# Patient Record
Sex: Female | Born: 1975 | Race: White | Hispanic: No | Marital: Married | Smoking: Never smoker
Health system: Southern US, Community
[De-identification: ages and names within clinical notes are randomized; demographics above are authoritative.]

## PROBLEM LIST (undated history)

## (undated) DIAGNOSIS — B002 Herpesviral gingivostomatitis and pharyngotonsillitis: Secondary | ICD-10-CM

## (undated) DIAGNOSIS — L309 Dermatitis, unspecified: Secondary | ICD-10-CM

## (undated) DIAGNOSIS — N6009 Solitary cyst of unspecified breast: Secondary | ICD-10-CM

## (undated) DIAGNOSIS — G510 Bell's palsy: Secondary | ICD-10-CM

## (undated) DIAGNOSIS — Z9289 Personal history of other medical treatment: Secondary | ICD-10-CM

## (undated) HISTORY — DX: Dermatitis, unspecified: L30.9

## (undated) HISTORY — DX: Bell's palsy: G51.0

## (undated) HISTORY — DX: Solitary cyst of unspecified breast: N60.09

## (undated) HISTORY — DX: Personal history of other medical treatment: Z92.89

## (undated) HISTORY — DX: Herpesviral gingivostomatitis and pharyngotonsillitis: B00.2

---
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
## 2001-11-20 DIAGNOSIS — G510 Bell's palsy: Secondary | ICD-10-CM

## 2001-11-20 HISTORY — DX: Bell's palsy: G51.0

## 2008-11-20 DIAGNOSIS — N6009 Solitary cyst of unspecified breast: Secondary | ICD-10-CM

## 2008-11-20 HISTORY — DX: Solitary cyst of unspecified breast: N60.09

## 2010-11-20 HISTORY — PX: HYSTEROSCOPY WITH D & C: SHX1775

## 2010-11-20 HISTORY — PX: ENDOMETRIAL ABLATION: SHX621

## 2011-09-13 ENCOUNTER — Ambulatory Visit: Payer: Self-pay

## 2011-09-15 ENCOUNTER — Ambulatory Visit: Payer: Self-pay

## 2011-09-18 LAB — PATHOLOGY REPORT

## 2014-03-16 DIAGNOSIS — Z9289 Personal history of other medical treatment: Secondary | ICD-10-CM

## 2014-03-16 HISTORY — DX: Personal history of other medical treatment: Z92.89

## 2014-03-16 LAB — HM PAP SMEAR: HM Pap smear: NEGATIVE

## 2014-03-27 ENCOUNTER — Ambulatory Visit: Payer: Self-pay

## 2016-04-13 ENCOUNTER — Other Ambulatory Visit: Payer: Self-pay | Admitting: Obstetrics and Gynecology

## 2016-04-13 DIAGNOSIS — Z1231 Encounter for screening mammogram for malignant neoplasm of breast: Secondary | ICD-10-CM

## 2016-04-19 ENCOUNTER — Ambulatory Visit
Admission: RE | Admit: 2016-04-19 | Discharge: 2016-04-19 | Disposition: A | Discharge status: Home / Self Care | Payer: 59 | Source: Ambulatory Visit | Attending: Obstetrics and Gynecology | Admitting: Obstetrics and Gynecology

## 2016-04-19 DIAGNOSIS — Z1231 Encounter for screening mammogram for malignant neoplasm of breast: Secondary | ICD-10-CM | POA: Insufficient documentation

## 2016-04-24 ENCOUNTER — Other Ambulatory Visit: Payer: Self-pay | Admitting: Obstetrics and Gynecology

## 2016-04-24 DIAGNOSIS — R928 Other abnormal and inconclusive findings on diagnostic imaging of breast: Secondary | ICD-10-CM

## 2016-04-28 ENCOUNTER — Ambulatory Visit
Admission: RE | Admit: 2016-04-28 | Discharge: 2016-04-28 | Disposition: A | Discharge status: Home / Self Care | Payer: 59 | Source: Ambulatory Visit | Attending: Obstetrics and Gynecology | Admitting: Obstetrics and Gynecology

## 2016-04-28 DIAGNOSIS — R928 Other abnormal and inconclusive findings on diagnostic imaging of breast: Secondary | ICD-10-CM | POA: Insufficient documentation

## 2016-04-28 LAB — HM MAMMOGRAPHY

## 2017-03-29 ENCOUNTER — Other Ambulatory Visit: Payer: Self-pay | Admitting: Obstetrics and Gynecology

## 2017-03-29 DIAGNOSIS — Z1231 Encounter for screening mammogram for malignant neoplasm of breast: Secondary | ICD-10-CM

## 2017-04-24 ENCOUNTER — Ambulatory Visit
Admission: RE | Admit: 2017-04-24 | Discharge: 2017-04-24 | Disposition: A | Discharge status: Home / Self Care | Payer: BLUE CROSS/BLUE SHIELD | Source: Ambulatory Visit | Attending: Obstetrics and Gynecology | Admitting: Obstetrics and Gynecology

## 2017-04-24 DIAGNOSIS — Z1231 Encounter for screening mammogram for malignant neoplasm of breast: Secondary | ICD-10-CM | POA: Diagnosis not present

## 2017-04-25 ENCOUNTER — Encounter: Payer: Self-pay | Admitting: Obstetrics and Gynecology

## 2017-05-07 ENCOUNTER — Ambulatory Visit: Payer: Self-pay | Admitting: Obstetrics and Gynecology

## 2017-05-15 ENCOUNTER — Ambulatory Visit (INDEPENDENT_AMBULATORY_CARE_PROVIDER_SITE_OTHER): Payer: BLUE CROSS/BLUE SHIELD | Admitting: Obstetrics and Gynecology

## 2017-05-15 ENCOUNTER — Encounter: Payer: Self-pay | Admitting: Obstetrics and Gynecology

## 2017-05-15 VITALS — BP 110/62 | HR 76 | Ht 62.0 in | Wt 130.0 lb

## 2017-05-15 DIAGNOSIS — Z124 Encounter for screening for malignant neoplasm of cervix: Secondary | ICD-10-CM

## 2017-05-15 DIAGNOSIS — Z1151 Encounter for screening for human papillomavirus (HPV): Secondary | ICD-10-CM | POA: Diagnosis not present

## 2017-05-15 DIAGNOSIS — Z01419 Encounter for gynecological examination (general) (routine) without abnormal findings: Secondary | ICD-10-CM | POA: Diagnosis not present

## 2017-05-15 NOTE — Progress Notes (Signed)
Chief Complaint  Patient presents with  . Gynecologic Exam     HPI:      [name] is a 41 y.o. [phone] who LMP was No LMP recorded. Patient has had an ablation., presents today for her annual examination.  Her menses are absent due to endometrial ablation. Dysmenorrhea none. She does not have intermenstrual bleeding.  Sex activity: single partner, contraception - vasectomy.  Last Pap: March 16, 2014  Results were: no abnormalities /neg HPV DNA Hx of STDs: none  Last mammogram: April 25, 2017  Results were: normal--routine follow-up in 12 months There is a FH of breast cancer in her mat grt aunt, genetic testing not indicated. There is no FH of ovarian cancer. The patient does do self-breast exams.  Tobacco use: The patient denies current or previous tobacco use. Alcohol use: none Exercise: moderately active  She does not get adequate calcium and Vitamin D in her diet.   Past Medical History:  Diagnosis Date  . Bell's palsy 2003  . History of mammogram 03/24/2015; [id number]   birads 2; neg  . History of Papanicolaou smear of cervix 03/16/2014   -/-  . Oral herpes   . Solitary cyst of breast 2010   left - DR. BYRNETT    Past Surgical History:  Procedure Laterality Date  . [place]  . CESAREAN SECTION    . ENDOMETRIAL ABLATION  2012  . HYSTEROSCOPY W/D&C  2012  . SPLENECTOMY  1982    Family History  Problem Relation Age of Onset  . Diabetes Maternal Grandfather        TYPE II  . Cancer Other        BREAST    Social History   Social History  . Marital status: Married    Spouse name: N/A  . Number of children: 3  . Years of education: 14   Occupational History  . NURSES' [employer]   Social History Main Topics  . Smoking status: Never Smoker  . Smokeless tobacco: Never Used  . Alcohol use No  . Drug use: No  . Sexual activity: Yes    Birth control/ protection: Surgical     Comment: VASECTOMY   Other Topics Concern    . Not on file   Social History Narrative  . No narrative on file     Current Outpatient Prescriptions:  .  valACYclovir (VALTREX) 1000 MG tablet, Take by mouth., Disp: , Rfl:   ROS:  Review of Systems  Constitutional: Negative for fatigue, fever and unexpected weight change.  Respiratory: Negative for cough, shortness of breath and wheezing.   Cardiovascular: Negative for chest pain, palpitations and leg swelling.  Gastrointestinal: Negative for blood in stool, constipation, diarrhea, nausea and vomiting.  Endocrine: Negative for cold intolerance, heat intolerance and polyuria.  Genitourinary: Negative for dyspareunia, dysuria, flank pain, frequency, genital sores, hematuria, menstrual problem, pelvic pain, urgency, vaginal bleeding, vaginal discharge and vaginal pain.  Musculoskeletal: Negative for back pain, joint swelling and myalgias.  Skin: Negative for rash.  Neurological: Negative for dizziness, syncope, light-headedness, numbness and headaches.  Hematological: Negative for adenopathy.  Psychiatric/Behavioral: Negative for agitation, confusion, sleep disturbance and suicidal ideas. The patient is not nervous/anxious.      Objective: BP 110/62 (BP Location: Left Arm, Patient Position: Sitting, Cuff Size: Normal)   Pulse 76   Ht 5' 2"  (1.575 m)   Wt 130 lb (59 kg)   BMI  23.78 kg/m    Physical Exam  Constitutional: She is oriented to person, place, and time. She appears well-developed and well-nourished.  Genitourinary: Vagina normal and uterus normal. There is no rash or tenderness on the right labia. There is no rash or tenderness on the left labia. No erythema or tenderness in the vagina. No vaginal discharge found. Right adnexum does not display mass and does not display tenderness. Left adnexum does not display mass and does not display tenderness. Cervix does not exhibit motion tenderness or polyp. Uterus is not enlarged or tender.  Neck: Normal range of motion. No  thyromegaly present.  Cardiovascular: Normal rate, regular rhythm and normal heart sounds.   No murmur heard. Pulmonary/Chest: Effort normal and breath sounds normal. Right breast exhibits no mass, no nipple discharge, no skin change and no tenderness. Left breast exhibits no mass, no nipple discharge, no skin change and no tenderness.  Abdominal: Soft. There is no tenderness. There is no guarding.  Musculoskeletal: Normal range of motion.  Neurological: She is alert and oriented to person, place, and time. No cranial nerve deficit.  Psychiatric: She has a normal mood and affect. Her behavior is normal.  Vitals reviewed.   Assessment/Plan:  Encounter for annual routine gynecological examination  Cervical cancer screening - Plan: IGP, Aptima HPV  Screening for HPV (human papillomavirus) - Plan: IGP, Aptima HPV           GYN counsel adequate intake of calcium and vitamin D, diet and exercise     F/U  Return in about 1 year (around 05/15/2018).  Alicia B. Copland, PA-C 05/15/2017 2:35 PM

## 2017-05-18 LAB — IGP, APTIMA HPV
HPV Aptima: NEGATIVE
PAP Smear Comment: 0

## 2017-05-21 ENCOUNTER — Encounter (HOSPITAL_COMMUNITY): Payer: Self-pay

## 2017-05-21 ENCOUNTER — Emergency Department (HOSPITAL_COMMUNITY)
Admission: EM | Admit: 2017-05-21 | Discharge: 2017-05-21 | Disposition: A | Discharge status: Home / Self Care | Payer: BLUE CROSS/BLUE SHIELD | Attending: Emergency Medicine | Admitting: Emergency Medicine

## 2017-05-21 ENCOUNTER — Emergency Department (HOSPITAL_COMMUNITY): Payer: BLUE CROSS/BLUE SHIELD

## 2017-05-21 DIAGNOSIS — Y999 Unspecified external cause status: Secondary | ICD-10-CM | POA: Insufficient documentation

## 2017-05-21 DIAGNOSIS — X58XXXA Exposure to other specified factors, initial encounter: Secondary | ICD-10-CM | POA: Diagnosis not present

## 2017-05-21 DIAGNOSIS — Y9319 Activity, other involving water and watercraft: Secondary | ICD-10-CM | POA: Diagnosis not present

## 2017-05-21 DIAGNOSIS — S53402A Unspecified sprain of left elbow, initial encounter: Secondary | ICD-10-CM

## 2017-05-21 DIAGNOSIS — Y9262 Dock or shipyard as the place of occurrence of the external cause: Secondary | ICD-10-CM | POA: Diagnosis not present

## 2017-05-21 DIAGNOSIS — S59901A Unspecified injury of right elbow, initial encounter: Secondary | ICD-10-CM | POA: Diagnosis present

## 2017-05-21 DIAGNOSIS — Z9889 Other specified postprocedural states: Secondary | ICD-10-CM | POA: Insufficient documentation

## 2017-05-21 NOTE — Discharge Instructions (Signed)
Apply an elbow sleeve or ACE wrap as needed for support, do not wear it continuously.  Ibuprofen or aleve if needed for pain.  Apply ice packs on/off.  Call your orthopedic provider or the one listed to arrange a follow-up appt in one week if not improving

## 2017-05-21 NOTE — ED Provider Notes (Signed)
[facility] DEPT Provider Note   CSN: [id number] Arrival date & time: 05/21/17  1922     History   Chief Complaint Chief Complaint  Patient presents with  . Arm Injury    HPI Joan Phillips is a 41 y.o. female.  HPI   Joan Phillips is a 41 y.o. female who presents to the Emergency Department complaining of left elbow pain for two days.  Complains of pain with movement and extension of the elbow.  She states that she "pushed off" a dock using her left arm.  Initially, she had pain to the left wrist that resolved, but the following day she noticed pain with certain movements at the elbow.  She denies weakness or numbness of the extremity, swelling, discoloration, shoulder or neck pain.     Past Medical History:  Diagnosis Date  . Bell's palsy 2003  . History of mammogram 03/24/2015; [id number]   birads 2; neg  . History of Papanicolaou smear of cervix 03/16/2014   -/-  . Oral herpes   . Solitary cyst of breast 2010   left - DR. BYRNETT    There are no active problems to display for this patient.   Past Surgical History:  Procedure Laterality Date  . [place]  . CESAREAN SECTION    . ENDOMETRIAL ABLATION  2012  . HYSTEROSCOPY W/D&C  2012  . SPLENECTOMY  1982    OB History    Gravida Para Term Preterm AB Living   3 3 3     3    SAB TAB Ectopic Multiple Live Births           3       Home Medications    Prior to Admission medications   Medication Sig Start Date End Date Taking? Authorizing Provider  valACYclovir (VALTREX) 1000 MG tablet Take by mouth.    [provider]    Family History Family History  Problem Relation Age of Onset  . Diabetes Maternal Grandfather        TYPE II  . Cancer Other        BREAST    Social History Social History  Substance Use Topics  . Smoking status: Never Smoker  . Smokeless tobacco: Never Used  . Alcohol use No     Allergies   Patient has no known allergies.   Review of Systems Review of  Systems  Constitutional: Negative for chills and fever.  Cardiovascular: Negative for chest pain.  Genitourinary: Negative for difficulty urinating and dysuria.  Musculoskeletal: Positive for arthralgias (left elbow pain). Negative for joint swelling, neck pain and neck stiffness.  Skin: Negative for color change, rash and wound.  Neurological: Negative for dizziness, weakness, numbness and headaches.  All other systems reviewed and are negative.    Physical Exam Updated Vital Signs BP 121/65 (BP Location: Right Arm)   Pulse 75   Temp 98.7 F (37.1 C) (Oral)   Resp 15   Ht 5' 2"  (1.575 m)   Wt 59 kg (130 lb)   SpO2 99%   BMI 23.78 kg/m   Physical Exam  Constitutional: She is oriented to person, place, and time. She appears well-developed and well-nourished. No distress.  HENT:  Head: Atraumatic.  Mouth/Throat: Oropharynx is clear and moist.  Neck: Normal range of motion. Neck supple.  Cardiovascular: Normal rate, regular rhythm and intact distal pulses.   Pulmonary/Chest: Effort normal. No respiratory distress.  Musculoskeletal: She exhibits tenderness. She exhibits no edema  or deformity.       Left elbow: She exhibits no swelling, no effusion and no deformity.  Focal ttp just superior to the left elbow joint.  Pain with full extension and pronation.  No effusion, erythema or edema of the joint. Grip strength is strong and symmetrical.   Neurological: She is alert and oriented to person, place, and time. No sensory deficit.  Skin: Skin is warm. Capillary refill takes less than 2 seconds. No rash noted. No erythema.  Nursing note and vitals reviewed.    ED Treatments / Results  Labs (all labs ordered are listed, but only abnormal results are displayed) Labs Reviewed - No data to display  EKG  EKG Interpretation None       Radiology Dg Elbow Complete Left  Result Date: 05/21/2017 CLINICAL DATA:  Elbow injury with pain. EXAM: LEFT ELBOW - COMPLETE 3+ VIEW  COMPARISON:  None. FINDINGS: No acute fracture is identified. No subluxation or dislocation. Degenerative changes are seen at the coronoid process of the ulna. Anterior fat pad is elevated and posterior fat pad is visible, findings consistent with the presence of a joint effusion. IMPRESSION: Positive for joint effusion without acute bony abnormality evident. Follow-up MRI may prove helpful to further evaluate. 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 Electronically Signed   By: Misty Stanley M.D.   On: 05/21/2017 19:55    Procedures Procedures (including critical care time)  Medications Ordered in ED Medications - No data to display   Initial Impression / Assessment and Plan / ED Course  I have reviewed the triage vital signs and the nursing notes.  Pertinent labs & imaging results that were available during my care of the patient were reviewed by me and considered in my medical decision making (see chart for details).     Patient is well-appearing. Vital signs are stable. Neurovascularly intact. X-ray reassuring. Symptoms are likely related to ligamentous injury.  Pt agrees to RICE therapy, anti-inflammatory and orthopedic f/u in one week if needed.   Final Clinical Impressions(s) / ED Diagnoses   Final diagnoses:  Sprain of left elbow, initial encounter    New Prescriptions Discharge Medication List as of 05/21/2017  8:19 PM       Kem Parkinson, PA-C 05/21/17 2051    Mesner, Corene Cornea, MD 05/24/17 [phone]

## 2017-05-21 NOTE — ED Triage Notes (Signed)
Reports of left elbow pain since Saturday after pushing self away from dock in water.

## 2017-05-21 NOTE — ED Notes (Signed)
Pt states understanding of care given and follow up instructions.  Pt a/o ambulated from ED with steady gait

## 2018-02-05 ENCOUNTER — Other Ambulatory Visit: Payer: Self-pay | Admitting: Obstetrics and Gynecology

## 2018-05-16 ENCOUNTER — Encounter: Payer: Self-pay | Admitting: Obstetrics and Gynecology

## 2018-05-16 ENCOUNTER — Ambulatory Visit (INDEPENDENT_AMBULATORY_CARE_PROVIDER_SITE_OTHER): Payer: BC Managed Care – PPO | Admitting: Obstetrics and Gynecology

## 2018-05-16 VITALS — BP 100/60 | Ht 61.0 in | Wt 129.0 lb

## 2018-05-16 DIAGNOSIS — Z13220 Encounter for screening for lipoid disorders: Secondary | ICD-10-CM | POA: Diagnosis not present

## 2018-05-16 DIAGNOSIS — Z1231 Encounter for screening mammogram for malignant neoplasm of breast: Secondary | ICD-10-CM | POA: Diagnosis not present

## 2018-05-16 DIAGNOSIS — Z0000 Encounter for general adult medical examination without abnormal findings: Secondary | ICD-10-CM

## 2018-05-16 DIAGNOSIS — Z1239 Encounter for other screening for malignant neoplasm of breast: Secondary | ICD-10-CM

## 2018-05-16 DIAGNOSIS — B002 Herpesviral gingivostomatitis and pharyngotonsillitis: Secondary | ICD-10-CM | POA: Diagnosis not present

## 2018-05-16 DIAGNOSIS — Z01411 Encounter for gynecological examination (general) (routine) with abnormal findings: Secondary | ICD-10-CM

## 2018-05-16 DIAGNOSIS — Z01419 Encounter for gynecological examination (general) (routine) without abnormal findings: Secondary | ICD-10-CM

## 2018-05-16 NOTE — Progress Notes (Signed)
Chief Complaint  Patient presents with  . Gynecologic Exam     HPI:      [name] is a 42 y.o. [phone] who LMP was No LMP recorded. Patient has had an ablation., presents today for her annual examination. Her menses are absent due to endometrial ablation. Dysmenorrhea none. She does not have intermenstrual bleeding.  Sex activity: single partner, contraception - vasectomy.  Last Pap: 05/15/17  Results were: no abnormalities /neg HPV DNA Hx of STDs: none  Last mammogram: April 24, 2017  Results were: normal--routine follow-up in 12 months There is a FH of breast cancer in her mat grt aunt, genetic testing not indicated. There is no FH of ovarian cancer. The patient does do self-breast exams.  Tobacco use: The patient denies current or previous tobacco use. Alcohol use: none Exercise: moderately active  She does get adequate calcium and Vitamin D in her diet.  No recent labs.   Past Medical History:  Diagnosis Date  . Bell's palsy 2003  . History of mammogram 03/24/2015; [id number]   birads 2; neg  . History of Papanicolaou smear of cervix 03/16/2014   -/-  . Oral herpes   . Solitary cyst of breast 2010   left - DR. BYRNETT    Past Surgical History:  Procedure Laterality Date  . [place]  . CESAREAN SECTION    . ENDOMETRIAL ABLATION  2012  . HYSTEROSCOPY W/D&C  2012  . SPLENECTOMY  1982    Family History  Problem Relation Age of Onset  . Diabetes Maternal Grandfather        TYPE II  . Cancer Other        BREAST    Social History   Socioeconomic History  . Marital status: Married    Spouse name: Not on file  . Number of children: 3  . Years of education: 57  . Highest education level: Not on file  Occupational History  . 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 Financial resource strain: Not on file  . Food insecurity:    Worry: Not on file    Inability: Not on file  .  Transportation needs:    Medical: Not on file    Non-medical: Not on file  Tobacco Use  . Smoking status: Never Smoker  . Smokeless tobacco: Never Used  Substance and Sexual Activity  . Alcohol use: No  . Drug use: No  . Sexual activity: Yes    Birth control/protection: Surgical    Comment: VASECTOMY  Lifestyle  . Physical activity:    Days per week: Not on file    Minutes per session: Not on file  . Stress: Not on file  Relationships  . Social connections:    Talks on phone: Not on file    Gets together: Not on file    Attends religious service: Not on file    Active member of club or organization: Not on file    Attends meetings of clubs or organizations: Not on file    Relationship status: Not on file  . Intimate partner violence:    Fear of current or ex partner: Not on file    Emotionally abused: Not on file    Physically abused: Not on file    Forced sexual activity: Not on file  Other Topics Concern  . Not on file  Social History Narrative  .  Not on file    Current Outpatient Medications on File Prior to Visit  Medication Sig Dispense Refill  . valACYclovir (VALTREX) 1000 MG tablet TAKE TWO TABLETS BY MOUTH TWICE DAILY FOR ONE DAY AS NEEDED FOR SYMPTOMS 30 tablet 0   No current facility-administered medications on file prior to visit.       ROS:  Review of Systems  Constitutional: Negative for fatigue, fever and unexpected weight change.  Respiratory: Negative for cough, shortness of breath and wheezing.   Cardiovascular: Negative for chest pain, palpitations and leg swelling.  Gastrointestinal: Negative for blood in stool, constipation, diarrhea, nausea and vomiting.  Endocrine: Negative for cold intolerance, heat intolerance and polyuria.  Genitourinary: Negative for dyspareunia, dysuria, flank pain, frequency, genital sores, hematuria, menstrual problem, pelvic pain, urgency, vaginal bleeding, vaginal discharge and vaginal pain.  Musculoskeletal:  Negative for back pain, joint swelling and myalgias.  Skin: Negative for rash.  Neurological: Negative for dizziness, syncope, light-headedness, numbness and headaches.  Hematological: Negative for adenopathy.  Psychiatric/Behavioral: Negative for agitation, confusion, sleep disturbance and suicidal ideas. The patient is not nervous/anxious.      Objective: BP 100/60   Ht 5' 1"  (1.549 m)   Wt 129 lb (58.5 kg)   BMI 24.37 kg/m    Physical Exam  Constitutional: She is oriented to person, place, and time. She appears well-developed and well-nourished.  Genitourinary: Vagina normal and uterus normal. There is no rash or tenderness on the right labia. There is no rash or tenderness on the left labia. No erythema or tenderness in the vagina. No vaginal discharge found. Right adnexum does not display mass and does not display tenderness. Left adnexum does not display mass and does not display tenderness. Cervix does not exhibit motion tenderness or polyp. Uterus is not enlarged or tender.  Neck: Normal range of motion. No thyromegaly present.  Cardiovascular: Normal rate, regular rhythm and normal heart sounds.  No murmur heard. Pulmonary/Chest: Effort normal and breath sounds normal. Right breast exhibits no mass, no nipple discharge, no skin change and no tenderness. Left breast exhibits no mass, no nipple discharge, no skin change and no tenderness.  Abdominal: Soft. There is no tenderness. There is no guarding.  Musculoskeletal: Normal range of motion.  Neurological: She is alert and oriented to person, place, and time. No cranial nerve deficit.  Psychiatric: She has a normal mood and affect. Her behavior is normal.  Vitals reviewed.   Assessment/Plan: Encounter for annual routine gynecological examination  Screening for breast cancer - Pt to sched mammo - Plan: MM DIGITAL SCREENING BILATERAL  Blood tests for routine general physical examination - Plan: Comprehensive metabolic panel,  Lipid panel  Screening cholesterol level - Plan: Lipid panel  Oral herpes - Will call for valtrex RF prn.   GYN counsel breast self exam, mammography screening, adequate intake of calcium and vitamin D, diet and exercise     F/U  Return in about 1 year (around 05/17/2019).  Alicia B. Copland, PA-C 05/16/2018 9:01 AM

## 2018-05-16 NOTE — Patient Instructions (Addendum)
I value your feedback and entrusting [place] with your care. If you get a [facility] patient survey, I would appreciate you taking the time to let us know about your experience today. Thank you!  [place] at [facility]: [phone]

## 2018-05-17 LAB — COMPREHENSIVE METABOLIC PANEL
A/G RATIO: 1.9 (ref 1.2–2.2)
ALBUMIN: 4.6 g/dL (ref 3.5–5.5)
ALT: 14 IU/L (ref 0–32)
AST: 13 IU/L (ref 0–40)
Alkaline Phosphatase: 46 IU/L (ref 39–117)
BUN/Creatinine Ratio: 16 (ref 9–23)
BUN: 13 mg/dL (ref 6–24)
Bilirubin Total: 0.7 mg/dL (ref 0.0–1.2)
CALCIUM: 9.7 mg/dL (ref 8.7–10.2)
CO2: 23 mmol/L (ref 20–29)
Chloride: 103 mmol/L (ref 96–106)
Creatinine, Ser: 0.82 mg/dL (ref 0.57–1.00)
GFR calc Af Amer: 101 mL/min/{1.73_m2} (ref >59)
GFR, EST NON AFRICAN AMERICAN: 87 mL/min/{1.73_m2} (ref >59)
Globulin, Total: 2.4 g/dL (ref 1.5–4.5)
Glucose: 89 mg/dL (ref 65–99)
POTASSIUM: 4.3 mmol/L (ref 3.5–5.2)
Sodium: 139 mmol/L (ref 134–144)
TOTAL PROTEIN: 7 g/dL (ref 6.0–8.5)

## 2018-05-17 LAB — LIPID PANEL
CHOL/HDL RATIO: 2.8 ratio (ref 0.0–4.4)
Cholesterol, Total: 156 mg/dL (ref 100–199)
HDL: 56 mg/dL (ref >39)
LDL Calculated: 90 mg/dL (ref 0–99)
Triglycerides: 50 mg/dL (ref 0–149)
VLDL Cholesterol Cal: 10 mg/dL (ref 5–40)

## 2018-05-31 ENCOUNTER — Ambulatory Visit
Admission: RE | Admit: 2018-05-31 | Discharge: 2018-05-31 | Disposition: A | Discharge status: Home / Self Care | Payer: BC Managed Care – PPO | Source: Ambulatory Visit | Attending: Obstetrics and Gynecology | Admitting: Obstetrics and Gynecology

## 2018-05-31 DIAGNOSIS — Z1239 Encounter for other screening for malignant neoplasm of breast: Secondary | ICD-10-CM

## 2018-05-31 DIAGNOSIS — Z1231 Encounter for screening mammogram for malignant neoplasm of breast: Secondary | ICD-10-CM | POA: Diagnosis not present

## 2018-06-03 ENCOUNTER — Other Ambulatory Visit: Payer: Self-pay | Admitting: Obstetrics and Gynecology

## 2018-06-03 DIAGNOSIS — N6320 Unspecified lump in the left breast, unspecified quadrant: Secondary | ICD-10-CM

## 2018-06-03 DIAGNOSIS — R928 Other abnormal and inconclusive findings on diagnostic imaging of breast: Secondary | ICD-10-CM

## 2018-06-11 ENCOUNTER — Encounter: Payer: Self-pay | Admitting: Obstetrics and Gynecology

## 2018-06-11 ENCOUNTER — Ambulatory Visit
Admission: RE | Admit: 2018-06-11 | Discharge: 2018-06-11 | Disposition: A | Discharge status: Home / Self Care | Payer: BC Managed Care – PPO | Source: Ambulatory Visit | Attending: Obstetrics and Gynecology | Admitting: Obstetrics and Gynecology

## 2018-06-11 DIAGNOSIS — R928 Other abnormal and inconclusive findings on diagnostic imaging of breast: Secondary | ICD-10-CM

## 2018-06-11 DIAGNOSIS — N6320 Unspecified lump in the left breast, unspecified quadrant: Secondary | ICD-10-CM

## 2019-03-07 IMAGING — MG MM DIGITAL SCREENING BILAT W/ TOMO W/ CAD
6 of 10 series · 6 of 26 positions shown · non-contrast
Comparison: Previous exam(s).

CLINICAL DATA: Screening.

EXAM:
DIGITAL SCREENING BILATERAL MAMMOGRAM WITH TOMO AND CAD

[R MLO synth-2D]
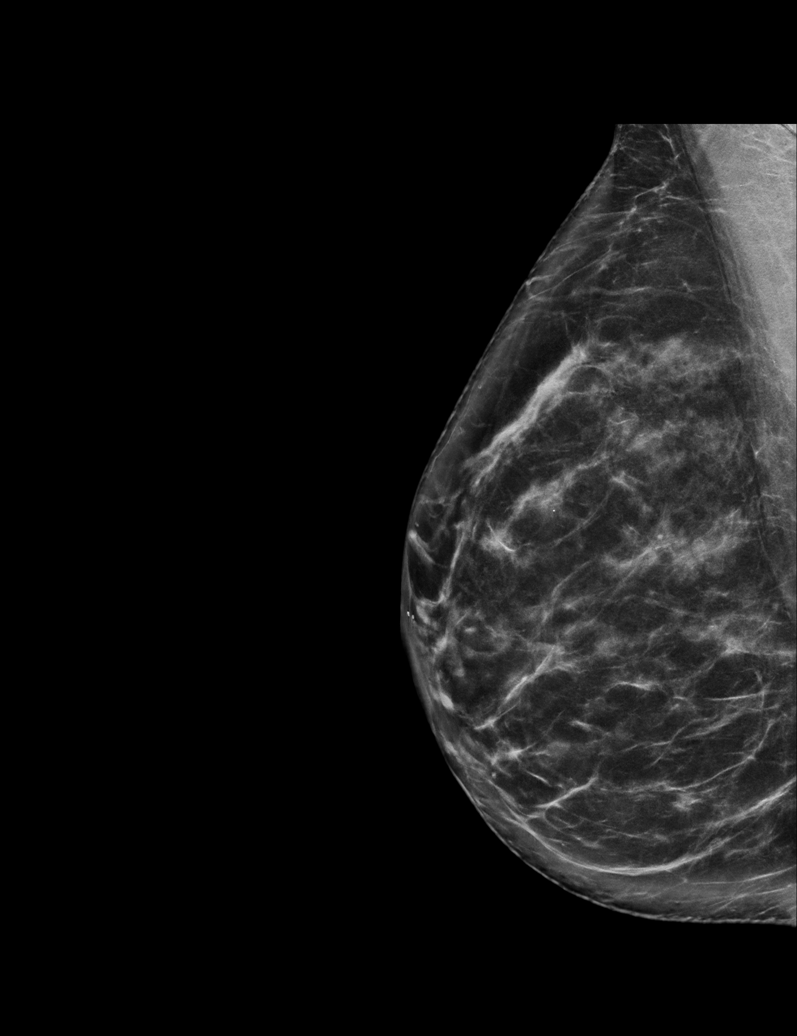

[L CC synth-2D]
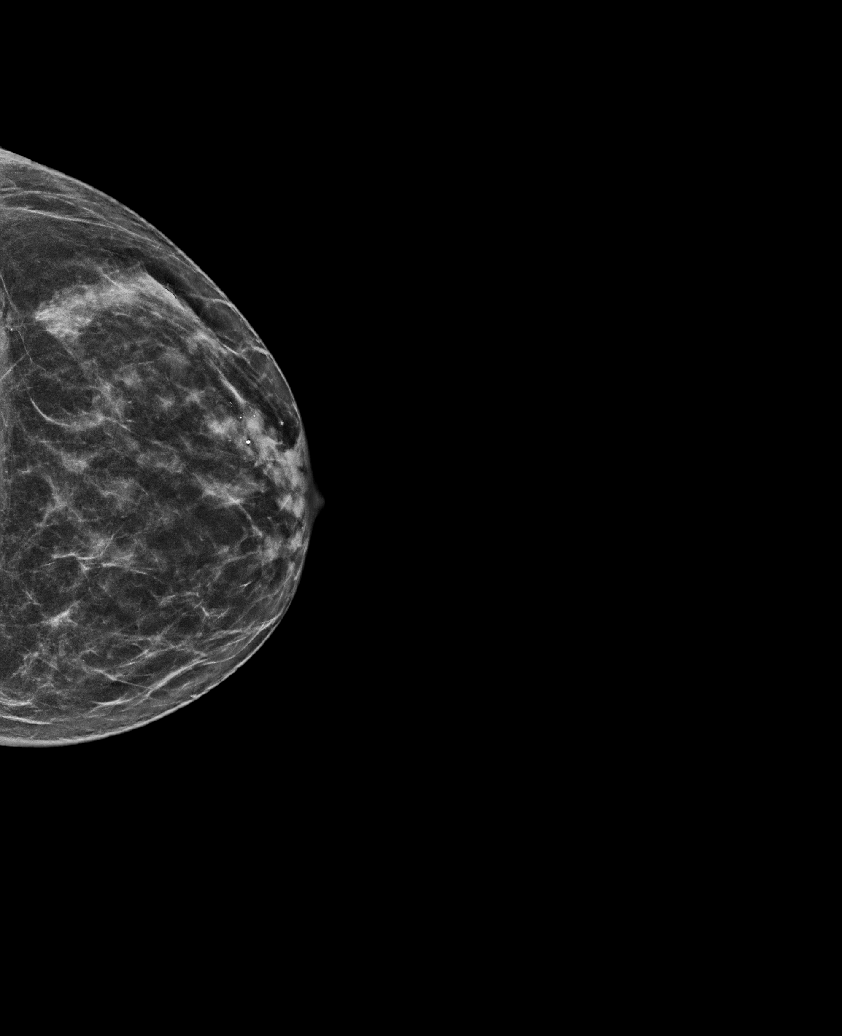

[R CC synth-2D]
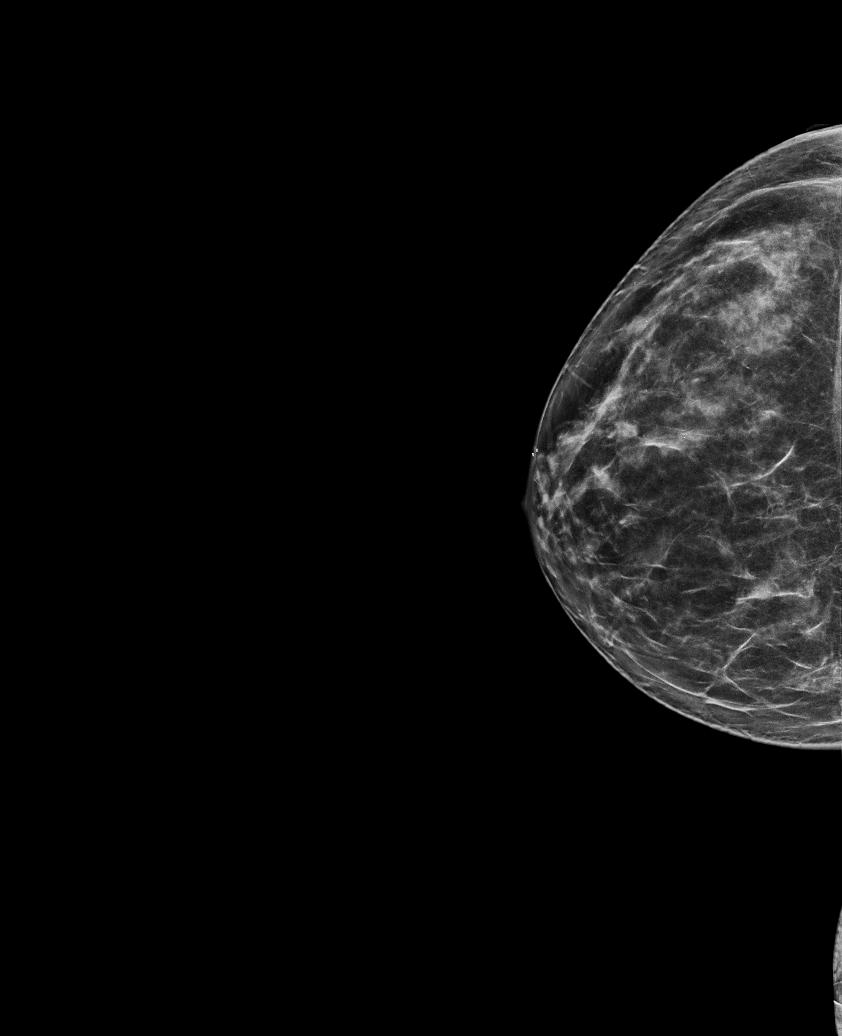

[L MLO synth-2D]
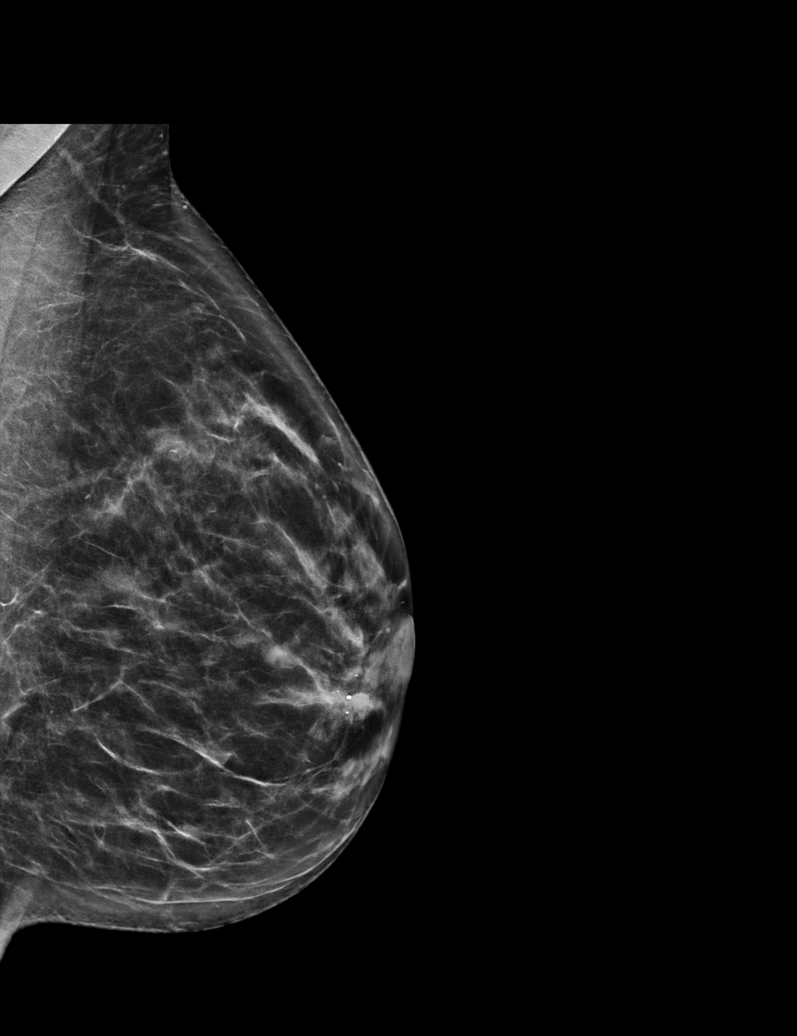

[L MLO (1 of 2)]
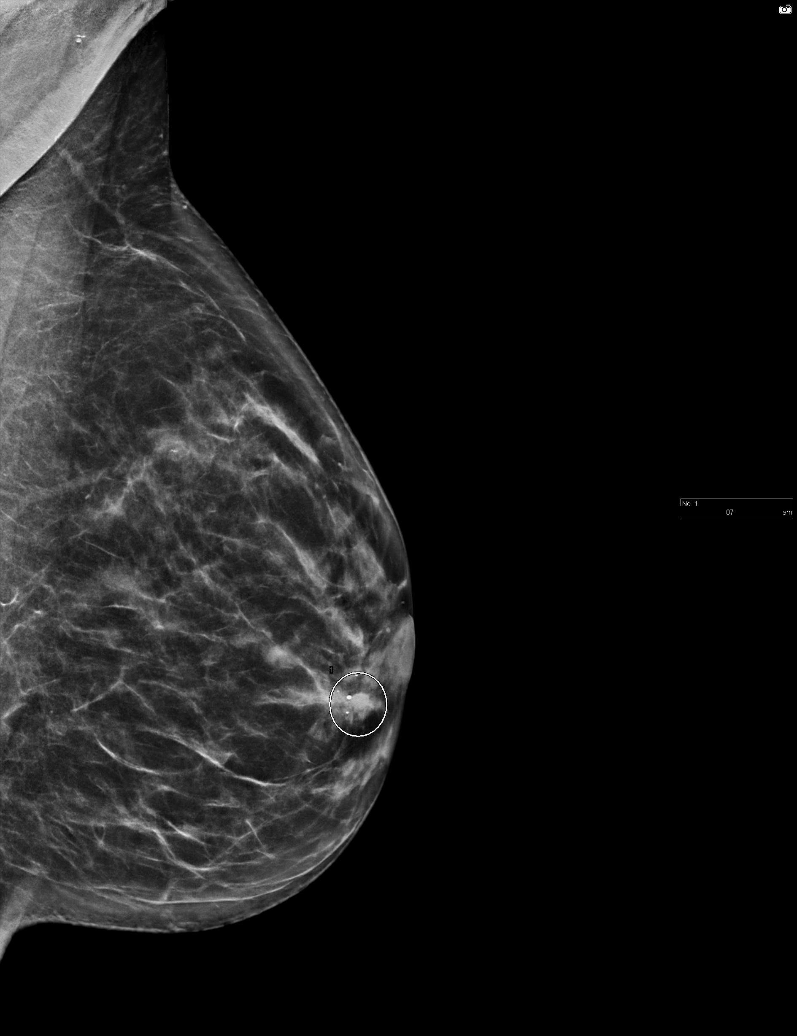

[L MLO (2 of 2)]
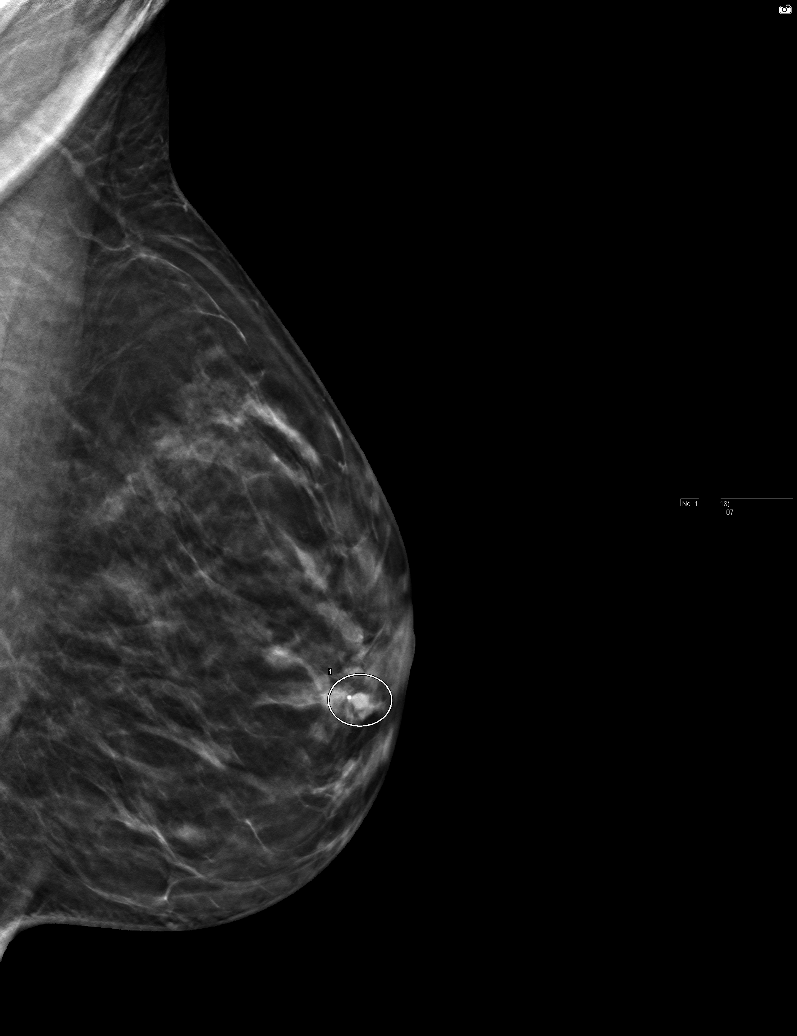

[6 of 26 positions shown; findings below may reference images not displayed]

ACR Breast Density Category b: There are scattered areas of
fibroglandular density.
FINDINGS: In the left breast, a possible mass warrants further evaluation.
This possible mass is seen within the lower subareolar LEFT breast,
anterior depth, MLO view only, tomosynthesis slice 18.

In the right breast, no findings suspicious for malignancy.

Images were processed with CAD.
IMPRESSION: Further evaluation is suggested for possible mass in the left
breast.

RECOMMENDATION:
Diagnostic mammogram and possibly ultrasound of the left breast.
(Code:I8-3-PPP)

The patient will be contacted regarding the findings, and additional
imaging will be scheduled.

BI-RADS CATEGORY  0: Incomplete. Need additional imaging evaluation
and/or prior mammograms for comparison.

## 2019-05-20 ENCOUNTER — Ambulatory Visit: Payer: BC Managed Care – PPO | Admitting: Obstetrics and Gynecology

## 2019-06-06 ENCOUNTER — Ambulatory Visit: Payer: BC Managed Care – PPO | Admitting: Obstetrics and Gynecology

## 2019-06-09 ENCOUNTER — Ambulatory Visit: Payer: BC Managed Care – PPO | Admitting: Obstetrics and Gynecology

## 2019-06-10 ENCOUNTER — Ambulatory Visit (INDEPENDENT_AMBULATORY_CARE_PROVIDER_SITE_OTHER): Payer: BC Managed Care – PPO | Admitting: Obstetrics and Gynecology

## 2019-06-10 ENCOUNTER — Encounter: Payer: Self-pay | Admitting: Obstetrics and Gynecology

## 2019-06-10 ENCOUNTER — Other Ambulatory Visit: Payer: Self-pay

## 2019-06-10 VITALS — BP 100/64 | Ht 63.0 in | Wt 132.6 lb

## 2019-06-10 DIAGNOSIS — Z01419 Encounter for gynecological examination (general) (routine) without abnormal findings: Secondary | ICD-10-CM | POA: Diagnosis not present

## 2019-06-10 DIAGNOSIS — B002 Herpesviral gingivostomatitis and pharyngotonsillitis: Secondary | ICD-10-CM

## 2019-06-10 DIAGNOSIS — Z1239 Encounter for other screening for malignant neoplasm of breast: Secondary | ICD-10-CM

## 2019-06-10 NOTE — Progress Notes (Signed)
Chief Complaint  Patient presents with  . Gynecologic Exam     HPI:      [name] is a 43 y.o. [phone] who LMP was No LMP recorded. Patient has had an ablation., presents today for her annual examination. Her menses are absent due to endometrial ablation. Dysmenorrhea none. She does not have intermenstrual bleeding. She is starting to have night sweats.  Sex activity: single partner, contraception - vasectomy.  Last Pap: 05/15/17  Results were: no abnormalities /neg HPV DNA Hx of STDs: none  Last mammogram: 06/11/18  Results were: normal--routine follow-up in 12 months There is a FH of breast cancer in her mat grt aunt, genetic testing not indicated. There is no FH of ovarian cancer. The patient does do self-breast exams.  Tobacco use: The patient denies current or previous tobacco use. Alcohol use: none Drug use: none Exercise: moderately active  She does get adequate calcium and Vitamin D in her diet.  Normal labs 2019 Hx of oral HSV, treats with valtrex. Doesn't need Rx currently.   Past Medical History:  Diagnosis Date  . Bell's palsy 2003  . History of mammogram 03/24/2015; [id number]   birads 2; neg  . History of Papanicolaou smear of cervix 03/16/2014   -/-  . Oral herpes   . Solitary cyst of breast 2010   left - DR. BYRNETT    Past Surgical History:  Procedure Laterality Date  . [place]  . CESAREAN SECTION    . ENDOMETRIAL ABLATION  2012  . HYSTEROSCOPY W/D&C  2012  . SPLENECTOMY  1982    Family History  Problem Relation Age of Onset  . Diabetes Maternal Grandfather        TYPE II  . Cancer Other        BREAST    Social History   Socioeconomic History  . Marital status: Married    Spouse name: Not on file  . Number of children: 3  . Years of education: 61  . Highest education level: Not on file  Occupational History  . 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 Financial  resource strain: Not on file  . Food insecurity    Worry: Not on file    Inability: Not on file  . Transportation needs    Medical: Not on file    Non-medical: Not on file  Tobacco Use  . Smoking status: Never Smoker  . Smokeless tobacco: Never Used  Substance and Sexual Activity  . Alcohol use: No  . Drug use: No  . Sexual activity: Yes    Birth control/protection: Surgical    Comment: VASECTOMY  Lifestyle  . Physical activity    Days per week: Not on file    Minutes per session: Not on file  . Stress: Not on file  Relationships  . Social Herbalist on phone: Not on file    Gets together: Not on file    Attends religious service: Not on file    Active member of club or organization: Not on file    Attends meetings of clubs or organizations: Not on file    Relationship status: Not on file  . Intimate partner violence    Fear of current or ex partner: Not on file    Emotionally abused: Not on file    Physically abused: Not on file  Forced sexual activity: Not on file  Other Topics Concern  . Not on file  Social History Narrative  . Not on file    Current Outpatient Medications on File Prior to Visit  Medication Sig Dispense Refill  . azelastine (ASTELIN) 0.1 % nasal spray USE 1 SPRAY IN EACH NOSTRIL TWICE A DAY    . bisacodyl (DULCOLAX) 5 MG EC tablet Take by mouth.    . loratadine (CLARITIN) 10 MG tablet Take by mouth.    . valACYclovir (VALTREX) 1000 MG tablet TAKE TWO TABLETS BY MOUTH TWICE DAILY FOR ONE DAY AS NEEDED FOR SYMPTOMS 30 tablet 0   No current facility-administered medications on file prior to visit.       ROS:  Review of Systems  Constitutional: Negative for fatigue, fever and unexpected weight change.  Respiratory: Negative for cough, shortness of breath and wheezing.   Cardiovascular: Negative for chest pain, palpitations and leg swelling.  Gastrointestinal: Negative for blood in stool, constipation, diarrhea, nausea and vomiting.   Endocrine: Negative for cold intolerance, heat intolerance and polyuria.  Genitourinary: Negative for dyspareunia, dysuria, flank pain, frequency, genital sores, hematuria, menstrual problem, pelvic pain, urgency, vaginal bleeding, vaginal discharge and vaginal pain.  Musculoskeletal: Negative for back pain, joint swelling and myalgias.  Skin: Negative for rash.  Neurological: Negative for dizziness, syncope, light-headedness, numbness and headaches.  Hematological: Negative for adenopathy.  Psychiatric/Behavioral: Negative for agitation, confusion, sleep disturbance and suicidal ideas. The patient is not nervous/anxious.      Objective: BP 100/64   Ht 5' 3"  (1.6 m)   Wt 132 lb 9.6 oz (60.1 kg)   BMI 23.49 kg/m    Physical Exam Constitutional:      Appearance: She is well-developed.  Genitourinary:     Vulva, vagina, uterus, right adnexa and left adnexa normal.     No vulval lesion or tenderness noted.     No vaginal discharge, erythema or tenderness.     No cervical motion tenderness or polyp.     Uterus is not enlarged or tender.     No right or left adnexal mass present.     Right adnexa not tender.     Left adnexa not tender.  Neck:     Musculoskeletal: Normal range of motion.     Thyroid: No thyromegaly.  Cardiovascular:     Rate and Rhythm: Normal rate and regular rhythm.     Heart sounds: Normal heart sounds. No murmur.  Pulmonary:     Effort: Pulmonary effort is normal.     Breath sounds: Normal breath sounds.  Chest:     Breasts:        Right: No mass, nipple discharge, skin change or tenderness.        Left: No mass, nipple discharge, skin change or tenderness.  Abdominal:     Palpations: Abdomen is soft.     Tenderness: There is no abdominal tenderness. There is no guarding.  Musculoskeletal: Normal range of motion.  Neurological:     General: No focal deficit present.     Mental Status: She is alert and oriented to person, place, and time.     Cranial  Nerves: No cranial nerve deficit.  Skin:    General: Skin is warm and dry.  Psychiatric:        Mood and Affect: Mood normal.        Behavior: Behavior normal.        Thought Content: Thought content normal.  Judgment: Judgment normal.  Vitals signs reviewed.     Assessment/Plan: Encounter for annual routine gynecological examination -   Screening for breast cancer - Plan: MM 3D SCREEN BREAST BILATERAL, Pt to sched mammo  Oral herpes - Plan: Will call for valtrex RF prn.  GYN counsel breast self exam, mammography screening, adequate intake of calcium and vitamin D, diet and exercise     F/U  Return in about 1 year (around 06/09/2020).   B. , PA-C 06/10/2019 3:58 PM

## 2019-06-10 NOTE — Patient Instructions (Signed)
I value your feedback and entrusting [place] with your care. If you get a [place] patient survey, I would appreciate you taking the time to let us know about your experience today. Thank you!  [place] at [facility]: [phone]

## 2019-07-21 ENCOUNTER — Ambulatory Visit
Admission: RE | Admit: 2019-07-21 | Discharge: 2019-07-21 | Disposition: A | Discharge status: Home / Self Care | Payer: BC Managed Care – PPO | Source: Ambulatory Visit | Attending: Obstetrics and Gynecology | Admitting: Obstetrics and Gynecology

## 2019-07-21 ENCOUNTER — Encounter: Payer: Self-pay | Admitting: Obstetrics and Gynecology

## 2019-07-21 DIAGNOSIS — Z1239 Encounter for other screening for malignant neoplasm of breast: Secondary | ICD-10-CM

## 2019-07-21 DIAGNOSIS — Z1231 Encounter for screening mammogram for malignant neoplasm of breast: Secondary | ICD-10-CM | POA: Diagnosis present

## 2020-03-25 ENCOUNTER — Other Ambulatory Visit: Payer: Self-pay | Admitting: Obstetrics and Gynecology

## 2020-03-25 ENCOUNTER — Telehealth: Payer: Self-pay

## 2020-03-25 MED ORDER — VALACYCLOVIR HCL 1 G PO TABS: ORAL_TABLET | ORAL | 0 refills | Status: DC

## 2020-03-25 NOTE — Telephone Encounter (Signed)
Patient requested refill of Valtrex from pharmacy 2 days ago and she checked on refill last night and was told it wasn't there. Patient knows [facility] usually refills quickly. States rx hasn't been filled at this pharmacy before ([facility]). Previous rx was at [facility] and they have shut down. [phone]

## 2020-03-25 NOTE — Telephone Encounter (Signed)
Pt aware.

## 2020-03-25 NOTE — Progress Notes (Signed)
Rx RF valtrex prn cold sores

## 2020-03-25 NOTE — Telephone Encounter (Signed)
Had not gotten Rx RF request. Pls notify pt Rx RF sent to new pharm. Thx.

## 2020-06-09 NOTE — Progress Notes (Signed)
Chief Complaint  Patient presents with   Gynecologic Exam     HPI:      [name] is a 44 y.o. [phone] who LMP was No LMP recorded. Patient has had an ablation., presents today for her annual examination. Her menses are absent due to endometrial ablation. Dysmenorrhea none. She does not have intermenstrual bleeding. She feels hot at night sometimes.  Sex activity: single partner, contraception - vasectomy.  Last Pap: 05/15/17  Results were: no abnormalities /neg HPV DNA Hx of STDs: none  Last mammogram: 07/21/19  Results were: normal--routine follow-up in 12 months There is a FH of breast cancer in her mat grt aunt, genetic testing not indicated. There is no FH of ovarian cancer. The patient does self-breast exams.  Tobacco use: The patient denies current or previous tobacco use. Alcohol use: none Drug use: none Exercise: moderately active  Colonoscopy: never  She does get adequate calcium but not Vitamin D in her diet.  Normal labs 2019 Hx of oral HSV, treats with valtrex. Doesn't need Rx currently.   Past Medical History:  Diagnosis Date   Bell's palsy 2003   History of mammogram 03/24/2015; [id number]   birads 2; neg   History of Papanicolaou smear of cervix 03/16/2014   -/-   Oral herpes    Solitary cyst of breast 2010   left - DR. 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[place]    Employer: [employer]     Comment: [employer]  Tobacco Use   Smoking status: Never Smoker   Smokeless tobacco: Never Used  Scientific laboratory technician Use: Never used  Substance and Sexual Activity   Alcohol use: No   Drug use: No   Sexual activity: Yes    Birth control/protection: Surgical    Comment: VASECTOMY  Other Topics Concern   Not on file  Social History Narrative   Not on file   Social Determinants of Health   Financial Resource Strain:    Difficulty of Paying Living Expenses:   Food Insecurity:    Worried About Charity fundraiser in the Last Year:    Arboriculturist in the Last Year:   Transportation Needs:    Film/video editor (Medical):    Lack of Transportation (Non-Medical):   Physical Activity:    Days of Exercise per Week:    Minutes of Exercise per Session:   Stress:    Feeling of Stress :   Social Connections:    Frequency of Communication with Friends and Family:    Frequency of Social Gatherings with Friends and Family:    Attends Religious Services:    Active Member of Clubs or Organizations:  Attends Archivist Meetings:    Marital Status:   Intimate Partner Violence:    Fear of Current or Ex-Partner:    Emotionally Abused:    Physically Abused:    Sexually Abused:     Current Outpatient Medications on File Prior to Visit  Medication Sig Dispense Refill   bisacodyl (DULCOLAX) 5 MG EC tablet Take by mouth.     valACYclovir (VALTREX) 1000 MG tablet TAKE TWO TABLETS BY MOUTH TWICE DAILY FOR ONE DAY AS NEEDED FOR SYMPTOMS 30 tablet 0   No current facility-administered medications on file prior to visit.      ROS:  Review of Systems  Constitutional: Negative for fatigue, fever and unexpected weight change.  Respiratory: Negative for cough, shortness of breath and wheezing.   Cardiovascular: Negative for chest pain, palpitations and leg swelling.  Gastrointestinal: Negative for blood in stool, constipation, diarrhea, nausea and  vomiting.  Endocrine: Negative for cold intolerance, heat intolerance and polyuria.  Genitourinary: Negative for dyspareunia, dysuria, flank pain, frequency, genital sores, hematuria, menstrual problem, pelvic pain, urgency, vaginal bleeding, vaginal discharge and vaginal pain.  Musculoskeletal: Negative for back pain, joint swelling and myalgias.  Skin: Negative for rash.  Neurological: Negative for dizziness, syncope, light-headedness, numbness and headaches.  Hematological: Negative for adenopathy.  Psychiatric/Behavioral: Negative for agitation, confusion, sleep disturbance and suicidal ideas. The patient is not nervous/anxious.      Objective: BP 114/80    Ht 5' 3"  (1.6 m)    Wt 135 lb (61.2 kg)    BMI 23.91 kg/m    Physical Exam Constitutional:      Appearance: She is well-developed.  Genitourinary:     Vulva, vagina, uterus, right adnexa and left adnexa normal.     No vulval lesion or tenderness noted.     No vaginal discharge, erythema or tenderness.     No cervical motion tenderness or polyp.     Uterus is not enlarged or tender.     No right or left adnexal mass present.     Right adnexa not tender.     Left adnexa not tender.  Neck:     Thyroid: No thyromegaly.  Cardiovascular:     Rate and Rhythm: Normal rate and regular rhythm.     Heart sounds: Normal heart sounds. No murmur heard.   Pulmonary:     Effort: Pulmonary effort is normal.     Breath sounds: Normal breath sounds.  Chest:     Breasts:        Right: No mass, nipple discharge, skin change or tenderness.        Left: No mass, nipple discharge, skin change or tenderness.  Abdominal:     Palpations: Abdomen is soft.     Tenderness: There is no abdominal tenderness. There is no guarding.  Musculoskeletal:        General: Normal range of motion.     Cervical back: Normal range of motion.  Neurological:     General: No focal deficit present.     Mental Status: She is alert and oriented to person,  place, and time.     Cranial Nerves: No cranial nerve deficit.  Skin:    General: Skin is warm and dry.  Psychiatric:        Mood and Affect: Mood normal.        Behavior: Behavior normal.        Thought Content: Thought content normal.        Judgment:  Judgment normal.  Vitals reviewed.     Assessment/Plan:  Encounter for annual routine gynecological examination  Encounter for screening mammogram for malignant neoplasm of breast - Plan: MM 3D SCREEN BREAST BILATERAL; pt to sched mammo  Screening for colon cancer - Plan: Cologuard; colonoscopy discussed. Will do cologuard for now  Oral herpes--will call for valtrex RF prn.     F/U  Return in about 1 year (around 06/10/2021).  Leoma Folds B. Abayomi Pattison, PA-C 06/10/2020 8:53 AM

## 2020-06-10 ENCOUNTER — Encounter: Payer: Self-pay | Admitting: Obstetrics and Gynecology

## 2020-06-10 ENCOUNTER — Other Ambulatory Visit: Payer: Self-pay

## 2020-06-10 ENCOUNTER — Ambulatory Visit (INDEPENDENT_AMBULATORY_CARE_PROVIDER_SITE_OTHER): Payer: BC Managed Care – PPO | Admitting: Obstetrics and Gynecology

## 2020-06-10 VITALS — BP 114/80 | Ht 63.0 in | Wt 135.0 lb

## 2020-06-10 DIAGNOSIS — Z01419 Encounter for gynecological examination (general) (routine) without abnormal findings: Secondary | ICD-10-CM | POA: Diagnosis not present

## 2020-06-10 DIAGNOSIS — Z1231 Encounter for screening mammogram for malignant neoplasm of breast: Secondary | ICD-10-CM

## 2020-06-10 DIAGNOSIS — B002 Herpesviral gingivostomatitis and pharyngotonsillitis: Secondary | ICD-10-CM

## 2020-06-10 DIAGNOSIS — Z1211 Encounter for screening for malignant neoplasm of colon: Secondary | ICD-10-CM

## 2020-06-10 NOTE — Patient Instructions (Signed)
I value your feedback and entrusting [place] with your care. If you get a [place] patient survey, I would appreciate you taking the time to let us know about your experience today. Thank you!  As of October 30, 2019, your lab results will be released to your MyChart immediately, before I even have a chance to see them. Please give me time to review them and contact you if there are any abnormalities. Thank you for your patience.   [place] at [facility]: [phone]

## 2020-06-20 DIAGNOSIS — Z1211 Encounter for screening for malignant neoplasm of colon: Secondary | ICD-10-CM

## 2020-06-20 HISTORY — DX: Encounter for screening for malignant neoplasm of colon: Z12.11

## 2020-06-24 LAB — COLOGUARD: Cologuard: NEGATIVE

## 2020-06-28 ENCOUNTER — Encounter: Payer: Self-pay | Admitting: Obstetrics and Gynecology

## 2020-06-28 ENCOUNTER — Telehealth: Payer: Self-pay

## 2020-06-28 NOTE — Telephone Encounter (Signed)
Pt aware of neg cologard results

## 2020-07-21 ENCOUNTER — Ambulatory Visit
Admission: RE | Admit: 2020-07-21 | Discharge: 2020-07-21 | Disposition: A | Discharge status: Home / Self Care | Payer: BC Managed Care – PPO | Source: Ambulatory Visit | Attending: Obstetrics and Gynecology | Admitting: Obstetrics and Gynecology

## 2020-07-21 ENCOUNTER — Other Ambulatory Visit: Payer: Self-pay

## 2020-07-21 DIAGNOSIS — Z1231 Encounter for screening mammogram for malignant neoplasm of breast: Secondary | ICD-10-CM

## 2020-07-22 ENCOUNTER — Encounter: Payer: Self-pay | Admitting: Obstetrics and Gynecology

## 2021-05-05 ENCOUNTER — Other Ambulatory Visit: Payer: Self-pay | Admitting: Obstetrics and Gynecology

## 2021-06-13 NOTE — Progress Notes (Signed)
Chief Complaint  Patient presents with   Gynecologic Exam    No concerns     HPI:      [name] is a 45 y.o. [phone] who LMP was No LMP recorded. Patient has had an ablation., presents today for her annual examination. Her menses are absent due to endometrial ablation. Dysmenorrhea none. She does not have intermenstrual bleeding. Does have some vasomotor sx.   Sex activity: single partner, contraception - vasectomy.  Last Pap: 05/15/17  Results were: no abnormalities /neg HPV DNA Hx of STDs: none   Last mammogram: 07/21/20  Results were: normal--routine follow-up in 12 months There is a FH of breast cancer in her mat grt aunt, genetic testing not indicated. There is no FH of ovarian cancer. The patient does self-breast exams.   Tobacco use: The patient denies current or previous tobacco use. Alcohol use: none Drug use: none Exercise: moderately active  Colonoscopy: never; neg Cologuard 8/21; repeat due after 3 yrs.   She does get adequate calcium but not Vitamin D in her diet.  Normal labs 2022 with PCP Hx of oral HSV, treats with valtrex. Doesn't need Rx currently.   Past Medical History:  Diagnosis Date   Bell's palsy 2003   Eczema    History of mammogram 03/24/2015; [id number]   birads 2; neg   History of Papanicolaou smear of cervix 03/16/2014   -/-   Oral herpes    Screening for colon cancer 06/2020   neg cologuard; repeat after 3 yrs   Solitary cyst of breast 2010   left - DR. 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[place]    Employer: [employer]    Comment: [employer] [employer]  Tobacco Use   Smoking status: Never   Smokeless tobacco: Never  Vaping Use   Vaping Use: Never used  Substance and Sexual Activity   Alcohol use: No   Drug use: No   Sexual activity: Yes    Birth control/protection: Surgical    Comment: VASECTOMY  Other Topics Concern   Not on file  Social History Narrative   Not on file   Social Determinants of Health   Financial Resource Strain: Not on file  Food Insecurity: Not on file  Transportation Needs: Not on file  Physical Activity: Not on file  Stress: Not on file  Social Connections: Not on file  Intimate Partner Violence: Not on file    Current Outpatient Medications on File Prior to Visit  Medication Sig Dispense Refill   acetaminophen (TYLENOL) 325 MG tablet Take by mouth.     bisacodyl (DULCOLAX) 5 MG EC tablet Take by mouth.     loratadine (CLARITIN) 10 MG tablet Take by mouth.     triamcinolone cream (KENALOG) 0.1 % Apply topically 2 (two) times  daily as needed.     valACYclovir (VALTREX) 1000 MG tablet TAKE 2 TABLETS BY MOUTH TWICE A DAY FOR 1 DAY AS NEEDED FOR SYMPTOMS 30 tablet 0   No current facility-administered medications on file prior to visit.      ROS:  Review of Systems  Constitutional:  Negative for fatigue, fever and unexpected weight change.  Respiratory:  Negative for cough, shortness of breath and wheezing.   Cardiovascular:  Negative for chest pain, palpitations and leg swelling.  Gastrointestinal:  Negative for blood in stool, constipation, diarrhea, nausea and vomiting.  Endocrine: Negative for cold intolerance, heat intolerance and polyuria.  Genitourinary:  Negative for dyspareunia, dysuria, flank pain, frequency, genital sores, hematuria, menstrual problem, pelvic pain, urgency, vaginal bleeding, vaginal discharge and vaginal pain.  Musculoskeletal:  Negative for back  pain, joint swelling and myalgias.  Skin:  Negative for rash.  Neurological:  Negative for dizziness, syncope, light-headedness, numbness and headaches.  Hematological:  Negative for adenopathy.  Psychiatric/Behavioral:  Negative for agitation, confusion, sleep disturbance and suicidal ideas. The patient is not nervous/anxious.     Objective: BP 118/64   Ht 5' 3"  (1.6 m)   Wt 145 lb (65.8 kg)   BMI 25.69 kg/m    Physical Exam Constitutional:      Appearance: She is well-developed.  Genitourinary:     Vulva normal.     Right Labia: No rash, tenderness or lesions.    Left Labia: No tenderness, lesions or rash.    No vaginal discharge, erythema or tenderness.      Right Adnexa: not tender and no mass present.    Left Adnexa: not tender and no mass present.    No cervical motion tenderness, friability or polyp.     Uterus is not enlarged or tender.  Breasts:    Right: No mass, nipple discharge, skin change or tenderness.     Left: No mass, nipple discharge, skin change or tenderness.  Neck:     Thyroid: No thyromegaly.  Cardiovascular:     Rate and Rhythm: Normal rate and regular rhythm.     Heart sounds: Normal heart sounds. No murmur heard. Pulmonary:     Effort: Pulmonary effort is normal.     Breath sounds: Normal breath sounds.  Abdominal:     Palpations: Abdomen is soft.     Tenderness: There is no abdominal tenderness. There is no guarding or rebound.  Musculoskeletal:        General: Normal range of motion.     Cervical back: Normal range of motion.  Lymphadenopathy:     Cervical: No cervical adenopathy.  Neurological:     General: No focal deficit present.     Mental Status: She is alert and oriented to person, place, and time.     Cranial Nerves: No cranial nerve deficit.  Skin:    General: Skin is warm and dry.  Psychiatric:        Mood and Affect: Mood normal.        Behavior: Behavior normal.        Thought Content: Thought content normal.         Judgment: Judgment normal.  Vitals reviewed.    Assessment/Plan:  Encounter for annual routine gynecological examination  Encounter for screening mammogram for malignant neoplasm of breast - Plan: MM 3D SCREEN BREAST BILATERAL; pt to sched mammo  Oral herpes--will call for valtrex RF prn.     F/U  Return in about 1 year (around 06/14/2022).  Sharell Hilmer B. Ashutosh Dieguez, PA-C 06/14/2021 8:42 AM

## 2021-06-14 ENCOUNTER — Other Ambulatory Visit: Payer: Self-pay

## 2021-06-14 ENCOUNTER — Encounter: Payer: Self-pay | Admitting: Obstetrics and Gynecology

## 2021-06-14 ENCOUNTER — Ambulatory Visit (INDEPENDENT_AMBULATORY_CARE_PROVIDER_SITE_OTHER): Payer: BC Managed Care – PPO | Admitting: Obstetrics and Gynecology

## 2021-06-14 VITALS — BP 118/64 | Ht 63.0 in | Wt 145.0 lb

## 2021-06-14 DIAGNOSIS — B002 Herpesviral gingivostomatitis and pharyngotonsillitis: Secondary | ICD-10-CM | POA: Diagnosis not present

## 2021-06-14 DIAGNOSIS — Z1231 Encounter for screening mammogram for malignant neoplasm of breast: Secondary | ICD-10-CM

## 2021-06-14 DIAGNOSIS — Z01419 Encounter for gynecological examination (general) (routine) without abnormal findings: Secondary | ICD-10-CM | POA: Diagnosis not present

## 2021-06-14 NOTE — Patient Instructions (Signed)
I value your feedback and you entrusting [place] with your care. If you get a [place] patient survey, I would appreciate you taking the time to let us know about your experience today. Thank you!  [place] at [facility]: [phone]

## 2021-07-27 ENCOUNTER — Other Ambulatory Visit: Payer: Self-pay

## 2021-07-27 ENCOUNTER — Ambulatory Visit
Admission: RE | Admit: 2021-07-27 | Discharge: 2021-07-27 | Disposition: A | Discharge status: Home / Self Care | Payer: BC Managed Care – PPO | Source: Ambulatory Visit | Attending: Obstetrics and Gynecology | Admitting: Obstetrics and Gynecology

## 2021-07-27 DIAGNOSIS — Z1231 Encounter for screening mammogram for malignant neoplasm of breast: Secondary | ICD-10-CM | POA: Diagnosis present

## 2021-07-29 ENCOUNTER — Other Ambulatory Visit: Payer: Self-pay | Admitting: Obstetrics and Gynecology

## 2021-07-29 DIAGNOSIS — R928 Other abnormal and inconclusive findings on diagnostic imaging of breast: Secondary | ICD-10-CM

## 2021-07-29 DIAGNOSIS — R921 Mammographic calcification found on diagnostic imaging of breast: Secondary | ICD-10-CM

## 2021-08-08 ENCOUNTER — Ambulatory Visit
Admission: RE | Admit: 2021-08-08 | Discharge: 2021-08-08 | Disposition: A | Discharge status: Home / Self Care | Payer: BC Managed Care – PPO | Source: Ambulatory Visit | Attending: Obstetrics and Gynecology | Admitting: Obstetrics and Gynecology

## 2021-08-08 ENCOUNTER — Other Ambulatory Visit: Payer: Self-pay

## 2021-08-08 DIAGNOSIS — R921 Mammographic calcification found on diagnostic imaging of breast: Secondary | ICD-10-CM | POA: Insufficient documentation

## 2021-08-08 DIAGNOSIS — R928 Other abnormal and inconclusive findings on diagnostic imaging of breast: Secondary | ICD-10-CM | POA: Insufficient documentation

## 2021-08-09 ENCOUNTER — Encounter: Payer: Self-pay | Admitting: Obstetrics and Gynecology

## 2022-02-17 ENCOUNTER — Ambulatory Visit
Admission: RE | Admit: 2022-02-17 | Discharge: 2022-02-17 | Disposition: A | Discharge status: Home / Self Care | Payer: BC Managed Care – PPO | Source: Ambulatory Visit | Attending: Obstetrics and Gynecology | Admitting: Obstetrics and Gynecology

## 2022-02-17 ENCOUNTER — Encounter: Payer: Self-pay | Admitting: Obstetrics and Gynecology

## 2022-02-17 DIAGNOSIS — R928 Other abnormal and inconclusive findings on diagnostic imaging of breast: Secondary | ICD-10-CM | POA: Diagnosis not present

## 2022-02-17 DIAGNOSIS — R921 Mammographic calcification found on diagnostic imaging of breast: Secondary | ICD-10-CM | POA: Insufficient documentation

## 2022-06-28 ENCOUNTER — Other Ambulatory Visit: Payer: Self-pay | Admitting: Obstetrics and Gynecology

## 2022-06-28 DIAGNOSIS — R921 Mammographic calcification found on diagnostic imaging of breast: Secondary | ICD-10-CM

## 2022-08-01 ENCOUNTER — Ambulatory Visit
Admission: RE | Admit: 2022-08-01 | Discharge: 2022-08-01 | Disposition: A | Discharge status: Home / Self Care | Payer: BC Managed Care – PPO | Source: Ambulatory Visit | Attending: Obstetrics and Gynecology | Admitting: Obstetrics and Gynecology

## 2022-08-01 DIAGNOSIS — R921 Mammographic calcification found on diagnostic imaging of breast: Secondary | ICD-10-CM | POA: Diagnosis present

## 2022-08-02 ENCOUNTER — Encounter: Payer: Self-pay | Admitting: Obstetrics and Gynecology

## 2022-08-14 NOTE — Progress Notes (Unsigned)
No chief complaint on file.    HPI:      [name] is a 46 y.o. [phone] who LMP was No LMP recorded. Patient has had an ablation., presents today for her annual examination. Her menses are absent due to endometrial ablation. Dysmenorrhea none. She does not have intermenstrual bleeding. Does have some vasomotor sx.   Sex activity: single partner, contraception - vasectomy.  Last Pap: 05/15/17  Results were: no abnormalities /neg HPV DNA Hx of STDs: none   Last mammogram: 08/01/22  Results were: cat 3 stable LT breast calcifications--routine follow-up in 12 months There is a FH of breast cancer in her mat grt aunt, genetic testing not indicated. There is no FH of ovarian cancer. The patient does self-breast exams.   Tobacco use: The patient denies current or previous tobacco use. Alcohol use: none Drug use: none Exercise: moderately active  Colonoscopy: never; neg Cologuard 8/21; repeat due after 3 yrs.   She does get adequate calcium but not Vitamin D in her diet.  Normal labs 2022 with PCP Hx of oral HSV, treats with valtrex. Doesn't need Rx currently.   Past Medical History:  Diagnosis Date   Bell's palsy 2003   Eczema    History of mammogram 03/24/2015; [id number]   birads 2; neg   History of Papanicolaou smear of cervix 03/16/2014   -/-   Oral herpes    Screening for colon cancer 06/2020   neg cologuard; repeat after 3 yrs   Solitary cyst of breast 2010   left - DR. 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[place]    Employer: [employer]    Comment: [place] [employer]  Tobacco Use   Smoking status: Never   Smokeless tobacco: Never  Vaping Use   Vaping Use: Never used  Substance and Sexual Activity   Alcohol use: No   Drug use: No   Sexual activity: Yes    Birth control/protection: Surgical    Comment: VASECTOMY  Other Topics Concern   Not on file  Social History Narrative   Not on file   Social Determinants of Health   Financial Resource Strain: Not on file  Food Insecurity: Not on file  Transportation Needs: Not on file  Physical Activity: Not on file  Stress: Not on file  Social Connections: Not on file  Intimate Partner Violence: Not on file    Current Outpatient Medications on File Prior to Visit  Medication Sig Dispense Refill   acetaminophen (TYLENOL) 325 MG tablet Take by mouth.     bisacodyl (DULCOLAX) 5 MG EC tablet Take by mouth.     loratadine (CLARITIN) 10 MG tablet Take by mouth.     triamcinolone cream (KENALOG) 0.1 % Apply topically 2 (two) times daily as needed.  valACYclovir (VALTREX) 1000 MG tablet TAKE 2 TABLETS BY MOUTH TWICE A DAY FOR 1 DAY AS NEEDED FOR SYMPTOMS 30 tablet 0   No current facility-administered medications on file prior to visit.      ROS:  Review of Systems  Constitutional:  Negative for fatigue, fever and unexpected weight change.  Respiratory:  Negative for cough, shortness of breath and wheezing.   Cardiovascular:  Negative for chest pain, palpitations and leg swelling.  Gastrointestinal:  Negative for blood in stool, constipation, diarrhea, nausea and vomiting.  Endocrine: Negative for cold intolerance, heat intolerance and polyuria.  Genitourinary:  Negative for dyspareunia, dysuria, flank pain, frequency, genital sores, hematuria, menstrual problem, pelvic pain, urgency, vaginal bleeding, vaginal discharge and vaginal pain.  Musculoskeletal:  Negative for back pain, joint  swelling and myalgias.  Skin:  Negative for rash.  Neurological:  Negative for dizziness, syncope, light-headedness, numbness and headaches.  Hematological:  Negative for adenopathy.  Psychiatric/Behavioral:  Negative for agitation, confusion, sleep disturbance and suicidal ideas. The patient is not nervous/anxious.      Objective: There were no vitals taken for this visit.   Physical Exam Constitutional:      Appearance: She is well-developed.  Genitourinary:     Vulva normal.     Right Labia: No rash, tenderness or lesions.    Left Labia: No tenderness, lesions or rash.    No vaginal discharge, erythema or tenderness.      Right Adnexa: not tender and no mass present.    Left Adnexa: not tender and no mass present.    No cervical motion tenderness, friability or polyp.     Uterus is not enlarged or tender.  Breasts:    Right: No mass, nipple discharge, skin change or tenderness.     Left: No mass, nipple discharge, skin change or tenderness.  Neck:     Thyroid: No thyromegaly.  Cardiovascular:     Rate and Rhythm: Normal rate and regular rhythm.     Heart sounds: Normal heart sounds. No murmur heard. Pulmonary:     Effort: Pulmonary effort is normal.     Breath sounds: Normal breath sounds.  Abdominal:     Palpations: Abdomen is soft.     Tenderness: There is no abdominal tenderness. There is no guarding or rebound.  Musculoskeletal:        General: Normal range of motion.     Cervical back: Normal range of motion.  Lymphadenopathy:     Cervical: No cervical adenopathy.  Neurological:     General: No focal deficit present.     Mental Status: She is alert and oriented to person, place, and time.     Cranial Nerves: No cranial nerve deficit.  Skin:    General: Skin is warm and dry.  Psychiatric:        Mood and Affect: Mood normal.        Behavior: Behavior normal.        Thought Content: Thought content normal.        Judgment: Judgment normal.  Vitals  reviewed.     Assessment/Plan:  Encounter for annual routine gynecological examination  Encounter for screening mammogram for malignant neoplasm of breast - Plan: MM 3D SCREEN BREAST BILATERAL; pt to sched mammo  Oral herpes--will call for valtrex RF prn.     F/U  No follow-ups on file.  Ethlyn Alto B. Hershell Brandl, PA-C 08/14/2022 2:20 PM

## 2022-08-15 ENCOUNTER — Encounter: Payer: Self-pay | Admitting: Obstetrics and Gynecology

## 2022-08-15 ENCOUNTER — Other Ambulatory Visit (HOSPITAL_COMMUNITY)
Admission: RE | Admit: 2022-08-15 | Discharge: 2022-08-15 | Disposition: A | Discharge status: Home / Self Care | Payer: BC Managed Care – PPO | Source: Ambulatory Visit | Attending: Obstetrics and Gynecology | Admitting: Obstetrics and Gynecology

## 2022-08-15 ENCOUNTER — Ambulatory Visit (INDEPENDENT_AMBULATORY_CARE_PROVIDER_SITE_OTHER): Payer: BC Managed Care – PPO | Admitting: Obstetrics and Gynecology

## 2022-08-15 VITALS — BP 104/60 | Ht 63.0 in | Wt 140.0 lb

## 2022-08-15 DIAGNOSIS — Z124 Encounter for screening for malignant neoplasm of cervix: Secondary | ICD-10-CM | POA: Diagnosis not present

## 2022-08-15 DIAGNOSIS — Z1151 Encounter for screening for human papillomavirus (HPV): Secondary | ICD-10-CM | POA: Insufficient documentation

## 2022-08-15 DIAGNOSIS — Z01419 Encounter for gynecological examination (general) (routine) without abnormal findings: Secondary | ICD-10-CM | POA: Diagnosis not present

## 2022-08-15 DIAGNOSIS — Z1231 Encounter for screening mammogram for malignant neoplasm of breast: Secondary | ICD-10-CM | POA: Diagnosis not present

## 2022-08-15 DIAGNOSIS — B002 Herpesviral gingivostomatitis and pharyngotonsillitis: Secondary | ICD-10-CM

## 2022-08-15 NOTE — Patient Instructions (Signed)
I value your feedback and you entrusting us with your care. If you get a [facility] patient survey, I would appreciate you taking the time to let us know about your experience today. Thank you! ? ? ?

## 2022-08-21 LAB — CYTOLOGY - PAP
Comment: NEGATIVE
Diagnosis: NEGATIVE
Diagnosis: REACTIVE
High risk HPV: NEGATIVE

## 2023-01-01 ENCOUNTER — Other Ambulatory Visit: Payer: Self-pay | Admitting: Obstetrics and Gynecology

## 2023-01-01 MED ORDER — VALACYCLOVIR HCL 1 G PO TABS: ORAL_TABLET | ORAL | 0 refills | Status: DC

## 2023-06-18 ENCOUNTER — Other Ambulatory Visit: Payer: Self-pay | Admitting: Obstetrics and Gynecology

## 2023-06-18 DIAGNOSIS — R921 Mammographic calcification found on diagnostic imaging of breast: Secondary | ICD-10-CM

## 2023-08-03 ENCOUNTER — Ambulatory Visit
Admission: RE | Admit: 2023-08-03 | Discharge: 2023-08-03 | Disposition: A | Discharge status: Home / Self Care | Payer: BC Managed Care – PPO | Source: Ambulatory Visit | Attending: Obstetrics and Gynecology | Admitting: Obstetrics and Gynecology

## 2023-08-03 DIAGNOSIS — R921 Mammographic calcification found on diagnostic imaging of breast: Secondary | ICD-10-CM | POA: Insufficient documentation

## 2023-08-05 ENCOUNTER — Encounter: Payer: Self-pay | Admitting: Obstetrics and Gynecology

## 2023-08-27 NOTE — Progress Notes (Unsigned)
No chief complaint on file.    HPI:      [name] is a 47 y.o. [phone] who LMP was No LMP recorded. Patient has had an ablation., presents today for her annual examination. Her menses are absent due to endometrial ablation. Dysmenorrhea none. She does not have intermenstrual bleeding.    Sex activity: single partner, contraception - vasectomy. No pain/bleeding Last Pap: 08/15/22 Results were: no abnormalities /neg HPV DNA Hx of STDs: none   Last mammogram: 08/03/23  Results were: normal with stable LT breast calcifications--routine follow-up in 12 months There is a FH of breast cancer in her mat grt aunt, genetic testing not indicated. There is no FH of ovarian cancer. The patient does self-breast exams.   Tobacco use: The patient denies current or previous tobacco use. Alcohol use: none Drug use: none Exercise: very active  Colonoscopy: never; neg Cologuard 8/21; repeat due after 3 yrs.   She does get adequate calcium but not Vitamin D in her diet.  Normal labs with PCP Hx of oral HSV, treats with valtrex. Doesn't need Rx currently.   Past Medical History:  Diagnosis Date   Bell's palsy 2003   Eczema    History of mammogram 03/24/2015; [id number]   birads 2; neg   History of Papanicolaou smear of cervix 03/16/2014   -/-   Oral herpes    Screening for colon cancer 06/2020   neg cologuard; repeat after 3 yrs   Solitary cyst of breast 2010   left - DR. BYRNETT    Past Surgical History:  Procedure Laterality Date   ARM SURGERY  1985   CESAREAN SECTION     ENDOMETRIAL ABLATION  2012   HYSTEROSCOPY WITH D & C  2012   SPLENECTOMY  1982    Family History  Problem Relation Age of Onset   Breast cancer Maternal Aunt    Diabetes Maternal Grandfather        TYPE II    Social History   Socioeconomic History   Marital status: Married    Spouse name: Not on file   Number of children: 3   Years of education: 14   Highest education level: Not on file   Occupational History   Occupation: N[employer]' [employer]    Employer: [employer]    Comment: [employer]  Tobacco Use   Smoking status: Never   Smokeless tobacco: Never  Vaping Use   Vaping status: Never Used  Substance and Sexual Activity   Alcohol use: No   Drug use: No   Sexual activity: Yes    Birth control/protection: Surgical    Comment: VASECTOMY  Other Topics Concern   Not on file  Social History Narrative   Not on file   Social Determinants of Health   Financial Resource Strain: Low Risk  (06/19/2023)   Received from [facility]   Overall Financial Resource Strain (CARDIA)    Difficulty of Paying Living Expenses: Not hard at all  Food Insecurity: No Food Insecurity (06/19/2023)   Received from [facility]   Hunger Vital Sign    Worried About Running Out of Food in the Last Year: Never true    Ran Out of Food in the Last Year: Never true  Transportation Needs: No Transportation Needs (06/19/2023)   Received from [facility] - [facility]    In the past 12 months, has lack of transportation kept you from medical  appointments or from getting medications?: No    Lack of Transportation (Non-Medical): No  Physical Activity: Not on file  Stress: Not on file  Social Connections: Not on file  Intimate Partner Violence: Not on file    Current Outpatient Medications on File Prior to Visit  Medication Sig Dispense Refill   acetaminophen (TYLENOL) 325 MG tablet Take by mouth.     bisacodyl (DULCOLAX) 5 MG EC tablet Take by mouth.     loratadine (CLARITIN) 10 MG tablet Take by mouth.     triamcinolone cream (KENALOG) 0.1 % Apply topically 2 (two) times daily as needed.     valACYclovir (VALTREX) 1000 MG tablet TAKE 2 TABLETS BY MOUTH TWICE A DAY FOR 1 DAY AS NEEDED FOR SYMPTOMS 30 tablet 0   No current facility-administered medications on file prior to visit.      ROS:  Review of Systems  Constitutional:   Negative for fatigue, fever and unexpected weight change.  Respiratory:  Negative for cough, shortness of breath and wheezing.   Cardiovascular:  Negative for chest pain, palpitations and leg swelling.  Gastrointestinal:  Negative for blood in stool, constipation, diarrhea, nausea and vomiting.  Endocrine: Negative for cold intolerance, heat intolerance and polyuria.  Genitourinary:  Negative for dyspareunia, dysuria, flank pain, frequency, genital sores, hematuria, menstrual problem, pelvic pain, urgency, vaginal bleeding, vaginal discharge and vaginal pain.  Musculoskeletal:  Negative for back pain, joint swelling and myalgias.  Skin:  Negative for rash.  Neurological:  Negative for dizziness, syncope, light-headedness, numbness and headaches.  Hematological:  Negative for adenopathy.  Psychiatric/Behavioral:  Negative for agitation, confusion, sleep disturbance and suicidal ideas. The patient is not nervous/anxious.      Objective: There were no vitals taken for this visit.   Physical Exam Constitutional:      Appearance: She is well-developed.  Genitourinary:     Vulva normal.     Right Labia: No rash, tenderness or lesions.    Left Labia: No tenderness, lesions or rash.    No vaginal discharge, erythema or tenderness.      Right Adnexa: not tender and no mass present.    Left Adnexa: not tender and no mass present.    No cervical motion tenderness, friability or polyp.     Uterus is not enlarged or tender.  Breasts:    Right: No mass, nipple discharge, skin change or tenderness.     Left: No mass, nipple discharge, skin change or tenderness.  Neck:     Thyroid: No thyromegaly.  Cardiovascular:     Rate and Rhythm: Normal rate and regular rhythm.     Heart sounds: Normal heart sounds. No murmur heard. Pulmonary:     Effort: Pulmonary effort is normal.     Breath sounds: Normal breath sounds.  Abdominal:     Palpations: Abdomen is soft.     Tenderness: There is no  abdominal tenderness. There is no guarding or rebound.  Musculoskeletal:        General: Normal range of motion.     Cervical back: Normal range of motion.  Lymphadenopathy:     Cervical: No cervical adenopathy.  Neurological:     General: No focal deficit present.     Mental Status: She is alert and oriented to person, place, and time.     Cranial Nerves: No cranial nerve deficit.  Skin:    General: Skin is warm and dry.  Psychiatric:        Mood and Affect:  Mood normal.        Behavior: Behavior normal.        Thought Content: Thought content normal.        Judgment: Judgment normal.  Vitals reviewed.     Assessment/Plan:  Encounter for annual routine gynecological examination  Cervical cancer screening - Plan: Cytology - PAP  Screening for HPV (human papillomavirus) - Plan: Cytology - PAP  Encounter for screening mammogram for malignant neoplasm of breast; pt current on mammo  Oral herpes--will call for valtrex RF prn.     F/U  No follow-ups on file.  Purvis Sidle B. Telesha Deguzman, PA-C 08/27/2023 4:40 PM

## 2023-08-28 ENCOUNTER — Ambulatory Visit (INDEPENDENT_AMBULATORY_CARE_PROVIDER_SITE_OTHER): Payer: BC Managed Care – PPO | Admitting: Obstetrics and Gynecology

## 2023-08-28 ENCOUNTER — Encounter: Payer: Self-pay | Admitting: Obstetrics and Gynecology

## 2023-08-28 VITALS — BP 110/70 | Ht 63.0 in | Wt 142.0 lb

## 2023-08-28 DIAGNOSIS — B002 Herpesviral gingivostomatitis and pharyngotonsillitis: Secondary | ICD-10-CM

## 2023-08-28 DIAGNOSIS — Z1211 Encounter for screening for malignant neoplasm of colon: Secondary | ICD-10-CM

## 2023-08-28 DIAGNOSIS — Z1231 Encounter for screening mammogram for malignant neoplasm of breast: Secondary | ICD-10-CM

## 2023-08-28 DIAGNOSIS — Z01419 Encounter for gynecological examination (general) (routine) without abnormal findings: Secondary | ICD-10-CM | POA: Diagnosis not present

## 2023-08-28 NOTE — Patient Instructions (Signed)
I value your feedback and you entrusting us with your care. If you get a [facility] patient survey, I would appreciate you taking the time to let us know about your experience today. Thank you! ? ? ?

## 2023-12-03 ENCOUNTER — Ambulatory Visit: Payer: 59

## 2023-12-03 DIAGNOSIS — Z1211 Encounter for screening for malignant neoplasm of colon: Secondary | ICD-10-CM | POA: Diagnosis present

## 2024-05-05 NOTE — Progress Notes (Unsigned)
 Joan Ano, MD   No chief complaint on file.   HPI:      [name] is a 48 y.o. [phone] whose LMP was No LMP recorded. Patient has had an ablation., presents today for ***   Her menses are absent due to endometrial ablation. Dysmenorrhea none. She does not have intermenstrual bleeding. Decreased vasomotor sx.   Patient Active Problem List   Diagnosis Date Noted   Oral herpes 05/16/2018    Past Surgical History:  Procedure Laterality Date   ARM SURGERY  1985   CESAREAN SECTION     ENDOMETRIAL ABLATION  2012   HYSTEROSCOPY WITH D & C  2012   SPLENECTOMY  1982    Family History  Problem Relation Age of Onset   Breast cancer Maternal Aunt        unsure of age   Diabetes Maternal Grandfather        TYPE II    Social History   Socioeconomic History   Marital status: Married    Spouse name: Not on file   Number of children: 3   Years of education: 14   Highest education level: Not on file  Occupational History   Occupation: N[employer]' [employer]    Employer: [employer]    Comment: [employer]  Tobacco Use   Smoking status: Never   Smokeless tobacco: Never  Vaping Use   Vaping status: Never Used  Substance and Sexual Activity   Alcohol use: No   Drug use: No   Sexual activity: Yes    Birth control/protection: Surgical    Comment: VASECTOMY  Other Topics Concern   Not on file  Social History Narrative   Not on file   Social Drivers of Health   Financial Resource Strain: Low Risk  (06/19/2023)   Received from [facility]   Overall Financial Resource Strain (CARDIA)    Difficulty of Paying Living Expenses: Not hard at all  Food Insecurity: No Food Insecurity (06/19/2023)   Received from [facility]   Hunger Vital Sign    Within the past 12 months, you worried that your food would run out before you got the money to buy more.: Never true    Within the past 12 months, the food you bought just didn't last and you  didn't have money to get more.: Never true  Transportation Needs: No Transportation Needs (06/19/2023)   Received from [facility] - Transportation    In the past 12 months, has lack of transportation kept you from medical appointments or from getting medications?: No    Lack of Transportation (Non-Medical): No  Physical Activity: Not on file  Stress: Not on file  Social Connections: Not on file  Intimate Partner Violence: Not on file    Outpatient Medications Prior to Visit  Medication Sig Dispense Refill   acetaminophen (TYLENOL) 325 MG tablet Take by mouth.     bisacodyl (DULCOLAX) 5 MG EC tablet Take by mouth.     valACYclovir  (VALTREX ) 1000 MG tablet TAKE 2 TABLETS BY MOUTH TWICE A DAY FOR 1 DAY AS NEEDED FOR SYMPTOMS 30 tablet 0   No facility-administered medications prior to visit.      ROS:  Review of Systems BREAST: No symptoms   OBJECTIVE:   Vitals:  There were no vitals taken for this visit.  Physical Exam  Results: No results found for this or any previous visit (from the  past 24 hours).   Assessment/Plan: No diagnosis found.    No orders of the defined types were placed in this encounter.     No follow-ups on file.  Jasmeet Manton B. Latarshia Jersey, PA-C 05/05/2024 8:18 PM

## 2024-05-06 ENCOUNTER — Ambulatory Visit: Payer: Self-pay | Admitting: Obstetrics and Gynecology

## 2024-05-06 ENCOUNTER — Encounter: Payer: Self-pay | Admitting: Obstetrics and Gynecology

## 2024-05-06 VITALS — BP 110/72 | Ht 63.0 in | Wt 153.0 lb

## 2024-05-06 DIAGNOSIS — N951 Menopausal and female climacteric states: Secondary | ICD-10-CM

## 2024-05-06 DIAGNOSIS — G4709 Other insomnia: Secondary | ICD-10-CM

## 2024-05-06 DIAGNOSIS — R635 Abnormal weight gain: Secondary | ICD-10-CM

## 2024-05-06 DIAGNOSIS — Z1329 Encounter for screening for other suspected endocrine disorder: Secondary | ICD-10-CM

## 2024-05-06 MED ORDER — VALACYCLOVIR HCL 1 G PO TABS: ORAL_TABLET | ORAL | 0 refills | Status: DC

## 2024-05-06 NOTE — Patient Instructions (Signed)
 I value your feedback and you entrusting [place] with your care. If you get a [facility] patient survey, I would appreciate you taking the time to let us know about your experience today. Thank you! ? ? ?

## 2024-05-07 ENCOUNTER — Ambulatory Visit: Payer: Self-pay | Admitting: Obstetrics and Gynecology

## 2024-05-07 LAB — TSH+FREE T4
Free T4: 1.06 ng/dL (ref 0.82–1.77)
TSH: 1.29 u[IU]/mL (ref 0.450–4.500)

## 2024-05-07 LAB — ESTRADIOL: Estradiol: 12.8 pg/mL

## 2024-05-07 LAB — FSH/LH
FSH: 141 m[IU]/mL
LH: 59.2 m[IU]/mL

## 2024-05-07 MED ORDER — NORETHINDRONE-ETH ESTRADIOL 1-5 MG-MCG PO TABS: 1.0000 | ORAL_TABLET | Freq: Every day | ORAL | 0 refills | Status: AC

## 2024-05-07 NOTE — Addendum Note (Signed)
 Addended by: Alyn Judge B on: 05/07/2024 07:14 PM   Modules accepted: Orders

## 2024-05-08 ENCOUNTER — Other Ambulatory Visit: Payer: Self-pay | Admitting: Obstetrics and Gynecology

## 2024-07-02 ENCOUNTER — Other Ambulatory Visit: Payer: Self-pay | Admitting: Obstetrics and Gynecology

## 2024-07-02 DIAGNOSIS — Z1231 Encounter for screening mammogram for malignant neoplasm of breast: Secondary | ICD-10-CM

## 2024-07-30 ENCOUNTER — Other Ambulatory Visit: Payer: Self-pay | Admitting: Obstetrics and Gynecology

## 2024-08-05 ENCOUNTER — Ambulatory Visit
Admission: RE | Admit: 2024-08-05 | Discharge: 2024-08-05 | Disposition: A | Discharge status: Home / Self Care | Source: Ambulatory Visit | Attending: Obstetrics and Gynecology | Admitting: Obstetrics and Gynecology

## 2024-08-05 DIAGNOSIS — Z1231 Encounter for screening mammogram for malignant neoplasm of breast: Secondary | ICD-10-CM | POA: Insufficient documentation

## 2024-08-06 ENCOUNTER — Other Ambulatory Visit: Payer: Self-pay | Admitting: Obstetrics and Gynecology

## 2024-08-06 DIAGNOSIS — R928 Other abnormal and inconclusive findings on diagnostic imaging of breast: Secondary | ICD-10-CM

## 2024-08-07 ENCOUNTER — Ambulatory Visit: Payer: Self-pay | Admitting: Obstetrics and Gynecology

## 2024-08-08 ENCOUNTER — Ambulatory Visit
Admission: RE | Admit: 2024-08-08 | Discharge: 2024-08-08 | Disposition: A | Discharge status: Home / Self Care | Source: Ambulatory Visit | Attending: Obstetrics and Gynecology | Admitting: Obstetrics and Gynecology

## 2024-08-08 DIAGNOSIS — R928 Other abnormal and inconclusive findings on diagnostic imaging of breast: Secondary | ICD-10-CM | POA: Insufficient documentation

## 2024-08-10 ENCOUNTER — Ambulatory Visit: Payer: Self-pay | Admitting: Obstetrics and Gynecology

## 2024-09-11 ENCOUNTER — Ambulatory Visit: Admitting: Obstetrics and Gynecology

## 2024-09-11 ENCOUNTER — Encounter: Payer: Self-pay | Admitting: Obstetrics and Gynecology

## 2024-09-11 VITALS — BP 115/75 | HR 74 | Ht 63.0 in | Wt 148.0 lb

## 2024-09-11 DIAGNOSIS — Z01419 Encounter for gynecological examination (general) (routine) without abnormal findings: Secondary | ICD-10-CM

## 2024-09-11 DIAGNOSIS — N951 Menopausal and female climacteric states: Secondary | ICD-10-CM | POA: Diagnosis not present

## 2024-09-11 DIAGNOSIS — B002 Herpesviral gingivostomatitis and pharyngotonsillitis: Secondary | ICD-10-CM | POA: Diagnosis not present

## 2024-09-11 DIAGNOSIS — Z1231 Encounter for screening mammogram for malignant neoplasm of breast: Secondary | ICD-10-CM

## 2024-09-11 MED ORDER — VALACYCLOVIR HCL 1 G PO TABS: ORAL_TABLET | ORAL | 1 refills | Status: AC

## 2024-09-11 NOTE — Progress Notes (Signed)
 Chief Complaint  Patient presents with   Gynecologic Exam    No concerns     HPI:      Ms. Joan HEARNS is a 48 y.o. [phone] who LMP was No LMP recorded. Patient has had an ablation., presents today for her annual examination. Her menses are absent due to endometrial ablation/menopause confirmed on 6/25 labs . No PMBD. Started Femhrt 1/5 6/25 due to hot flashes, night sweats, trouble sleeping, mood changes, and wt gain with increased breast size (no masses/tenderness). Took it for 2 months and didn't notice any changes, so stopped it. VS sx minimal now.   Sex activity: single partner, contraception - vasectomy. No pain/bleeding, has some dryness improved with lubricants.  Last Pap: 08/15/22 Results were: no abnormalities /neg HPV DNA Hx of STDs: none   Last mammogram: 08/08/24  Results were: normal with stable LT breast calcifications/addl views--routine follow-up in 12 months. There is a FH of breast cancer in her mat grt aunt, genetic testing not indicated. There is no FH of ovarian cancer. The patient does self-breast exams.   Tobacco use: The patient denies current or previous tobacco use. Alcohol use: none Drug use: none Exercise: mod active  Colonoscopy: 1/25 at [facility], repeat due after 10 yrs. Neg Cologuard 8/21; repeat due after 3 yrs.   She does get adequate calcium but not Vitamin D in her diet.  Normal labs with PCP Hx of oral HSV, treats with valtrex .   Past Medical History:  Diagnosis Date   Bell's palsy 2003   Eczema    History of mammogram 03/24/2015; [id number]   birads 2; neg   History of Papanicolaou smear of cervix 03/16/2014   -/-   Oral herpes    Screening for colon cancer 06/2020   neg cologuard; repeat after 3 yrs   Solitary cyst of breast 2010   left - DR. BYRNETT    Past Surgical History:  Procedure Laterality Date   ARM SURGERY  1985   CESAREAN SECTION     ENDOMETRIAL ABLATION  2012   HYSTEROSCOPY WITH D & C  2012   SPLENECTOMY  1982     Family History  Problem Relation Age of Onset   Breast cancer Maternal Aunt        unsure of age   Diabetes Maternal Grandfather        TYPE II    Social History   Socioeconomic History   Marital status: Married    Spouse name: Not on file   Number of children: 3   Years of education: 14   Highest education level: Not on file  Occupational History   Occupation: NURSES[employer]    Employer: [employer]    Comment: [employer]  Tobacco Use   Smoking status: Never   Smokeless tobacco: Never  Vaping Use   Vaping status: Never Used  Substance and Sexual Activity   Alcohol use: No   Drug use: No   Sexual activity: Yes    Birth control/protection: Surgical    Comment: VASECTOMY  Other Topics Concern   Not on file  Social History Narrative   Not on file   Social Drivers of Health   Financial Resource Strain: Low Risk  (06/30/2024)   Received from [facility]   Overall Financial Resource Strain (CARDIA)    Difficulty of Paying Living Expenses: Not hard at all  Food Insecurity: No Food Insecurity (06/30/2024)   [facility]   Hunger Vital Sign    Within the past 12 months, you worried that your food would run out before you got the money to buy more.: Never true    Within the past 12 months, the food you bought just didn't last and you didn't have money to get more.: Never true  Transportation Needs: No Transportation Needs (06/30/2024)   Received from [facility] - Transportation    In the past 12 months, has lack of transportation kept you from medical appointments or from getting medications?: No    Lack of Transportation (Non-Medical): No  Physical Activity: Not on file  Stress: Not on file  Social Connections: Not on file  Intimate Partner Violence: Not on file    Current Outpatient Medications on File Prior to Visit  Medication Sig Dispense Refill   acetaminophen (TYLENOL) 325 MG tablet  Take by mouth.     bisacodyl (DULCOLAX) 5 MG EC tablet Take by mouth.     Multiple Vitamin (MULTIVITAMINS PO) Take by mouth.     traZODone (DESYREL) 50 MG tablet Take 25-50 mg by mouth at bedtime as needed.     norethindrone -ethinyl estradiol  (FEMHRT 1/5) 1-5 MG-MCG TABS tablet Take 1 tablet by mouth daily. (Patient not taking: Reported on 09/11/2024) 84 tablet 0   No current facility-administered medications on file prior to visit.      ROS:  Review of Systems  Constitutional:  Negative for fatigue, fever and unexpected weight change.  Respiratory:  Negative for cough, shortness of breath and wheezing.   Cardiovascular:  Negative for chest pain, palpitations and leg swelling.  Gastrointestinal:  Negative for blood in stool, constipation, diarrhea, nausea and vomiting.  Endocrine: Negative for cold intolerance, heat intolerance and polyuria.  Genitourinary:  Negative for dyspareunia, dysuria, flank pain, frequency, genital sores, hematuria, menstrual problem, pelvic pain, urgency, vaginal bleeding, vaginal discharge and vaginal pain.  Musculoskeletal:  Negative for back pain, joint swelling and myalgias.  Skin:  Negative for rash.  Neurological:  Negative for dizziness, syncope, light-headedness, numbness and headaches.  Hematological:  Negative for adenopathy.  Psychiatric/Behavioral:  Negative for agitation, confusion, sleep disturbance and suicidal ideas. The patient is not nervous/anxious.      Objective: BP 115/75   Pulse 74   Ht 5' 3 (1.6 m)   Wt 148 lb (67.1 kg)   BMI 26.22 kg/m    Physical Exam Constitutional:      Appearance: She is well-developed.  Genitourinary:     Vulva normal.     Right Labia: No rash, tenderness or lesions.    Left Labia: No tenderness, lesions or rash.    No vaginal discharge, erythema or tenderness.      Right Adnexa: not tender and no mass present.    Left Adnexa: not tender and no mass present.    No cervical motion tenderness,  friability or polyp.     Uterus is not enlarged or tender.  Breasts:    Right: No mass, nipple discharge, skin change or tenderness.     Left: No mass, nipple discharge, skin change or tenderness.  Neck:     Thyroid: No thyromegaly.  Cardiovascular:     Rate and Rhythm: Normal rate and regular rhythm.     Heart sounds: Normal heart sounds. No murmur heard. Pulmonary:     Effort: Pulmonary effort is normal.     Breath sounds: Normal breath sounds.  Abdominal:     Palpations: Abdomen is  soft.     Tenderness: There is no abdominal tenderness. There is no guarding or rebound.  Musculoskeletal:        General: Normal range of motion.     Cervical back: Normal range of motion.  Lymphadenopathy:     Cervical: No cervical adenopathy.  Neurological:     General: No focal deficit present.     Mental Status: She is alert and oriented to person, place, and time.     Cranial Nerves: No cranial nerve deficit.  Skin:    General: Skin is warm and dry.  Psychiatric:        Mood and Affect: Mood normal.        Behavior: Behavior normal.        Thought Content: Thought content normal.        Judgment: Judgment normal.  Vitals reviewed.     Assessment/Plan: Encounter for annual routine gynecological examination  Encounter for screening mammogram for malignant neoplasm of breast; pt current on mammo  Vasomotor symptoms due to menopause--no change after 2 months on HRT, pt declines tx for now.   Oral herpes - Plan: valACYclovir  (VALTREX ) 1000 MG tablet; Rx RF eRxd prn sx.   Meds ordered this encounter  Medications   valACYclovir  (VALTREX ) 1000 MG tablet    Sig: TAKE 2 TABLETS BY MOUTH TWICE A DAY FOR 1 DAY AS NEEDED FOR SYMPTOMS    Dispense:  30 tablet    Refill:  1    Supervising Provider:   ROBY, MICIA [[id number]]      F/U  Return in about 1 year (around 09/11/2025).  Afshin Chrystal B. Evon Lopezperez, PA-C 09/11/2024 3:14 PM

## 2024-09-11 NOTE — Patient Instructions (Signed)
 I value your feedback and you entrusting [place] with your care. If you get a [facility] patient survey, I would appreciate you taking the time to let us know about your experience today. Thank you! ? ? ?
